# Patient Record
Sex: Female | Born: 1980 | Race: White | Marital: Married | Smoking: Never smoker
Health system: Northeastern US, Academic
[De-identification: ages and names within clinical notes are randomized; demographics above are authoritative.]

## PROBLEM LIST (undated history)

## (undated) ENCOUNTER — Inpatient Hospital Stay (HOSPITAL_COMMUNITY): Payer: BC Managed Care – PPO

## (undated) DIAGNOSIS — Z8619 Personal history of other infectious and parasitic diseases: Secondary | ICD-10-CM

## (undated) DIAGNOSIS — B977 Papillomavirus as the cause of diseases classified elsewhere: Secondary | ICD-10-CM

## (undated) DIAGNOSIS — O99111 Other diseases of the blood and blood-forming organs and certain disorders involving the immune mechanism complicating pregnancy, first trimester: Secondary | ICD-10-CM

## (undated) DIAGNOSIS — D6851 Activated protein C resistance: Secondary | ICD-10-CM

## (undated) HISTORY — PX: MOUTH SURGERY: SHX715

## (undated) HISTORY — DX: Activated protein C resistance: D68.51

## (undated) HISTORY — DX: Personal history of other infectious and parasitic diseases: Z86.19

## (undated) HISTORY — DX: Activated protein C resistance: O99.111

## (undated) HISTORY — PX: ANTERIOR CRUCIATE LIGAMENT REPAIR: SHX115

---

## 2001-04-25 ENCOUNTER — Other Ambulatory Visit: Payer: Self-pay | Admitting: Cardiology

## 2006-05-09 DIAGNOSIS — J309 Allergic rhinitis, unspecified: Secondary | ICD-10-CM | POA: Insufficient documentation

## 2012-03-26 DIAGNOSIS — S51809A Unspecified open wound of unspecified forearm, initial encounter: Secondary | ICD-10-CM | POA: Insufficient documentation

## 2012-03-26 DIAGNOSIS — Y998 Other external cause status: Secondary | ICD-10-CM | POA: Insufficient documentation

## 2012-03-26 DIAGNOSIS — Y93K1 Activity, walking an animal: Secondary | ICD-10-CM | POA: Insufficient documentation

## 2012-03-26 DIAGNOSIS — Y92009 Unspecified place in unspecified non-institutional (private) residence as the place of occurrence of the external cause: Secondary | ICD-10-CM | POA: Insufficient documentation

## 2012-03-26 DIAGNOSIS — W540XXA Bitten by dog, initial encounter: Secondary | ICD-10-CM | POA: Insufficient documentation

## 2012-03-27 ENCOUNTER — Emergency Department (HOSPITAL_COMMUNITY): Payer: BC Managed Care – PPO

## 2012-03-27 ENCOUNTER — Emergency Department (HOSPITAL_COMMUNITY)
Admission: EM | Admit: 2012-03-27 | Discharge: 2012-03-27 | Disposition: A | Discharge status: Home Health | Payer: BC Managed Care – PPO | Attending: Emergency Medicine | Admitting: Emergency Medicine

## 2012-03-27 ENCOUNTER — Encounter (HOSPITAL_COMMUNITY): Payer: Self-pay

## 2012-03-27 DIAGNOSIS — S41151A Open bite of right upper arm, initial encounter: Secondary | ICD-10-CM

## 2012-03-27 MED ORDER — HYDROCODONE-ACETAMINOPHEN 5-325 MG PO TABS
1.0000 | ORAL_TABLET | Freq: Once | ORAL | Status: AC
  Administered 2012-03-27: 1 via ORAL
  Filled 2012-03-27: qty 1

## 2012-03-27 MED ORDER — AMOXICILLIN-POT CLAVULANATE 875-125 MG PO TABS: 1.0000 | ORAL_TABLET | Freq: Two times a day (BID) | ORAL | Status: AC

## 2012-03-27 MED ORDER — AMOXICILLIN-POT CLAVULANATE 875-125 MG PO TABS
1.0000 | ORAL_TABLET | Freq: Once | ORAL | Status: AC
  Administered 2012-03-27: 1 via ORAL
  Filled 2012-03-27: qty 1

## 2012-03-27 MED ORDER — HYDROCODONE-ACETAMINOPHEN 5-325 MG PO TABS: 1.0000 | ORAL_TABLET | ORAL | Status: AC | PRN

## 2012-03-27 NOTE — ED Provider Notes (Signed)
Medical screening examination/treatment/procedure(s) were conducted as a shared visit with non-physician practitioner(s) and myself.  I personally evaluated the patient during the encounter  Infection warnings were given.  The patient's wounds were irrigated the bedside.  She was discharged home on Augmentin.  She understands to return to the ER for signs of infection as we discussed  Lyanne Co, MD 03/27/12 [phone]

## 2012-03-27 NOTE — Discharge Instructions (Signed)
Take the antibiotics as directed - follow up with Animal [employer] to see if the other dog has rabies up to date.  If you are still having pain after one week, then call Dr. Glenna Durand office and follow up with him.  Return here if needed.

## 2012-03-27 NOTE — ED Notes (Signed)
Pt was breaking up a fight between this dog and hers, dog has its shots and patient is familiar with the dog

## 2012-03-27 NOTE — ED Notes (Signed)
Pt stated as we were walking back to the room that she felt like her arm was broken because the dog shook it several times

## 2012-03-27 NOTE — ED Provider Notes (Signed)
History     CSN: [id number]  Arrival date & time 03/26/12  2328   First MD Initiated Contact with Patient 03/27/12 0158      Chief Complaint  Patient presents with  . Animal Bite    (Consider location/radiation/quality/duration/timing/severity/associated sxs/prior treatment) HPI Comments: Patient here with right forearm dog bite - she states that she was walking her dog when the neighbor's dog got out and started to attack her smaller dog - she states that she tried to stop the attack and was bitten once on the right forearm - reports last tetanus 2006 - spoke with the neighbor before coming here and was informed that the dog's shots were up to date. She reports pain at the bite site with mild numbness distal to the injury - she has full range of motion and no decrease in sensation at this time.  Patient is a 31 y.o. female presenting with animal bite. The history is provided by the patient. No language interpreter was used.  Animal Bite  The incident occurred just prior to arrival. The incident occurred at home. She came to the ER via personal transport. There is an injury to the right forearm. The pain is moderate. It is unlikely that a foreign body is present. Associated symptoms include numbness. Pertinent negatives include no chest pain, no visual disturbance, no abdominal pain, no bowel incontinence, no nausea, no vomiting, no bladder incontinence, no headaches, no hearing loss, no inability to bear weight, no neck pain, no pain when bearing weight, no focal weakness, no decreased responsiveness, no light-headedness, no loss of consciousness, no seizures, no tingling, no weakness, no cough, no difficulty breathing and no memory loss. There have been no prior injuries to these areas. She is right-handed. Her tetanus status is UTD. She has been behaving normally. She has received no recent medical care.    History reviewed. No pertinent past medical history.  History reviewed. No  pertinent past surgical history.  History reviewed. No pertinent family history.  History  Substance Use Topics  . Smoking status: Not on file  . Smokeless tobacco: Not on file  . Alcohol Use: No    OB History    Grav Para Term Preterm Abortions TAB SAB Ect Mult Living                  Review of Systems  Constitutional: Negative for decreased responsiveness.  HENT: Negative for hearing loss and neck pain.   Eyes: Negative for visual disturbance.  Respiratory: Negative for cough.   Cardiovascular: Negative for chest pain.  Gastrointestinal: Negative for nausea, vomiting, abdominal pain and bowel incontinence.  Genitourinary: Negative for bladder incontinence.  Neurological: Positive for numbness. Negative for tingling, focal weakness, seizures, loss of consciousness, weakness, light-headedness and headaches.  Psychiatric/Behavioral: Negative for memory loss.  All other systems reviewed and are negative.    Allergies  Review of patient's allergies indicates no known allergies.  Home Medications  No current outpatient prescriptions on file.  BP 120/81  Pulse 79  Temp(Src) 98.6 F (37 C) (Oral)  Resp 14  SpO2 99%  LMP 03/12/2012  Physical Exam  Nursing note and vitals reviewed. Constitutional: She is oriented to person, place, and time. She appears well-developed and well-nourished. No distress.  HENT:  Head: Normocephalic and atraumatic.  Right Ear: External ear normal.  Left Ear: External ear normal.  Nose: Nose normal.  Mouth/Throat: Oropharynx is clear and moist. No oropharyngeal exudate.  Eyes: Conjunctivae are normal. Pupils are equal,  round, and reactive to light. No scleral icterus.  Neck: Normal range of motion. Neck supple.  Cardiovascular: Normal rate, regular rhythm and normal heart sounds.  Exam reveals no gallop and no friction rub.   No murmur heard. Pulmonary/Chest: Effort normal and breath sounds normal. No respiratory distress. She has no  wheezes. She has no rales. She exhibits no tenderness.  Abdominal: Soft. Bowel sounds are normal. She exhibits no distension. There is no tenderness.  Musculoskeletal: Normal range of motion. She exhibits edema and tenderness.       Right forearm: She exhibits tenderness, swelling and laceration. She exhibits no bony tenderness, no edema and no deformity.       Arms: Lymphadenopathy:    She has no cervical adenopathy.  Neurological: She is alert and oriented to person, place, and time. A cranial nerve deficit is present. She exhibits normal muscle tone. Coordination normal.  Skin: Skin is warm and dry. No rash noted. No erythema.  Psychiatric: She has a normal mood and affect. Her behavior is normal. Thought content normal.    ED Course  Procedures (including critical care time)  Labs Reviewed - No data to display Dg Forearm Right  03/27/2012  *RADIOLOGY REPORT*  Clinical Data: Dog bite with puncture wound along the radial side of the forearm  RIGHT FOREARM - 2 VIEW  Comparison: None.  Findings: Proximal bandaging noted.  No fracture, foreign body, or retained canine tooth noted.  Soft tissue swelling is noted along the dorsal forearm proximally.  IMPRESSION:  1.  Soft tissue swelling.  No retained foreign body or fracture.  Original Report Authenticated By: Dellia Cloud, M.D.     Dog bite to right forearm    MDM  Patient with uncomplicated dog bite to right forearm.  I have spoken with the patient about getting proof of the other dog's immunization status and she knows to return here or Urgent Care if she needs the rabies series.  X-ray shows no foreign body and has been started on abx prophylactically. Scarlette Calico C. [place], [place] 03/27/12 [phone]

## 2012-11-17 ENCOUNTER — Other Ambulatory Visit (HOSPITAL_COMMUNITY)
Admission: RE | Admit: 2012-11-17 | Discharge: 2012-11-17 | Disposition: A | Discharge status: Home / Self Care | Payer: BC Managed Care – PPO | Source: Ambulatory Visit | Attending: Family Medicine | Admitting: Family Medicine

## 2012-11-17 ENCOUNTER — Other Ambulatory Visit: Payer: Self-pay | Admitting: Family Medicine

## 2012-11-17 DIAGNOSIS — Z124 Encounter for screening for malignant neoplasm of cervix: Secondary | ICD-10-CM | POA: Insufficient documentation

## 2013-06-24 IMAGING — CR DG FOREARM 2V*R*
2 series · 2 of 2 positions shown · non-contrast
Comparison: None.

CLINICAL DATA: Dog bite with puncture wound along the radial side
of the forearm

RIGHT FOREARM - 2 VIEW

[x forearm ap right]
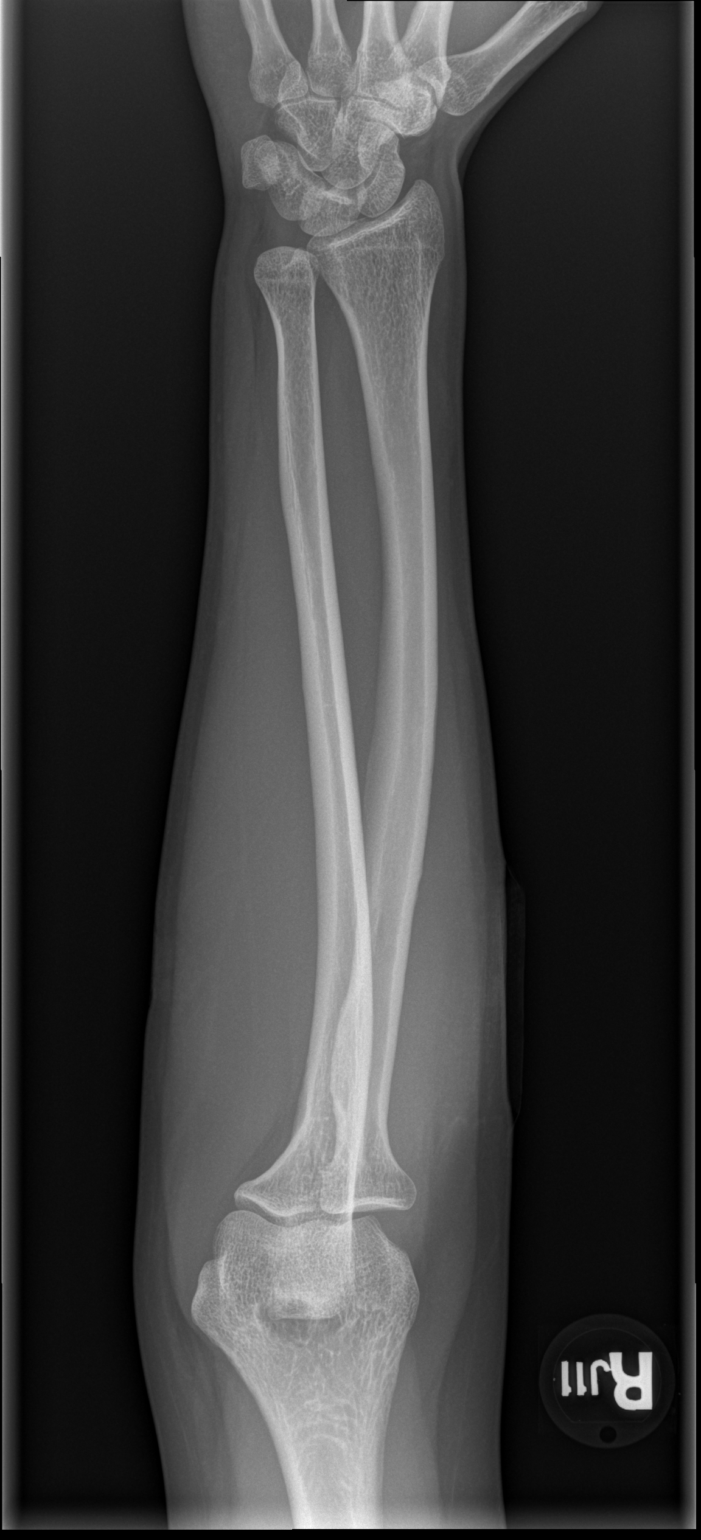

[x forearm lat right]
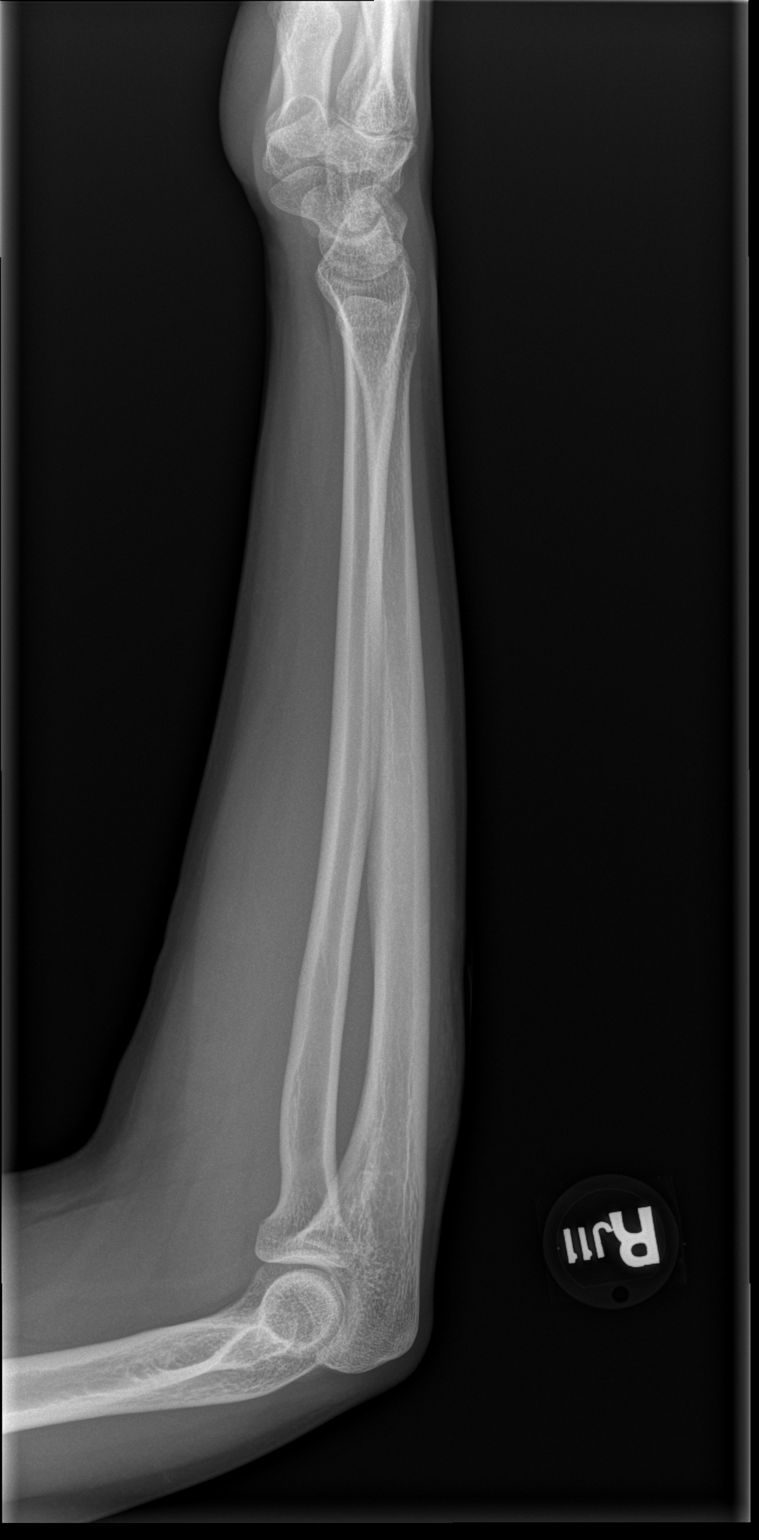

[2 of 2 positions shown; findings below may reference images not displayed]

FINDINGS: Proximal bandaging noted.  No fracture, foreign body, or
retained canine tooth noted.  Soft tissue swelling is noted along
the dorsal forearm proximally.
IMPRESSION: 1.  Soft tissue swelling.  No retained foreign body or fracture.

## 2015-04-28 LAB — OB RESULTS CONSOLE RUBELLA ANTIBODY, IGM: RUBELLA: IMMUNE

## 2015-04-28 LAB — OB RESULTS CONSOLE HEPATITIS B SURFACE ANTIGEN: Hepatitis B Surface Ag: NEGATIVE

## 2015-04-28 LAB — OB RESULTS CONSOLE ABO/RH: RH Type: POSITIVE

## 2015-04-28 LAB — OB RESULTS CONSOLE HIV ANTIBODY (ROUTINE TESTING): HIV: NONREACTIVE

## 2015-04-28 LAB — OB RESULTS CONSOLE RPR: RPR: NONREACTIVE

## 2015-04-28 LAB — OB RESULTS CONSOLE ANTIBODY SCREEN: ANTIBODY SCREEN: NEGATIVE

## 2015-05-11 LAB — OB RESULTS CONSOLE GC/CHLAMYDIA
Chlamydia: NEGATIVE
GC PROBE AMP, GENITAL: NEGATIVE

## 2015-11-13 NOTE — L&D Delivery Note (Signed)
In room to evaluate pushing in light of recent elevated temp that required initiation of antibiotics. Pt in hands/knees position. Pushing now x 2 hrs w/ good effort. She reported fatigue but wanted to continue as long as possible. Dr. Estanislado Pandy consulted and ok with pushing a little longer provided pt was making progress. She pushed for another hour and delivered as follows:   Delivery Note At 6:26 AM a viable female "Marissa Molina" was delivered via Vaginal, Spontaneous Delivery (Presentation: Direct OP). APGARS: 7, 9; weight 7 lbs 0.9 oz (3200 g). End stage meconium noted.    Placenta status: Intact, Spontaneous. Cord: 3 vessels with the following complications: Loose nuchal x 1. Cord pH: NA  Anesthesia: Epidural  Episiotomy: None Lacerations: 2nd degree Perineal Suture Repair: 3.0 vicryl CT-1 Est. Blood Loss (mL): 200  Mom to postpartum.  Baby to Couplet care / Skin to Skin.  Birth control not discussed.  Circumcision not discussed.  Factor V Leiden carrier-heterozygous -- will begin Lovenox prophylaxis per protocol.  Continue IV antibiotics (Unasyn) x 24 hrs.  Sherre Scarlet 12/25/2015, 7:46 AM

## 2015-11-24 LAB — OB RESULTS CONSOLE GBS: GBS: POSITIVE

## 2015-12-13 ENCOUNTER — Telehealth (HOSPITAL_COMMUNITY): Payer: Self-pay | Admitting: *Deleted

## 2015-12-13 ENCOUNTER — Encounter (HOSPITAL_COMMUNITY): Payer: Self-pay | Admitting: *Deleted

## 2015-12-13 NOTE — Telephone Encounter (Signed)
Preadmission screen  

## 2015-12-15 ENCOUNTER — Telehealth (HOSPITAL_COMMUNITY): Payer: Self-pay | Admitting: *Deleted

## 2015-12-15 ENCOUNTER — Encounter (HOSPITAL_COMMUNITY): Payer: Self-pay | Admitting: *Deleted

## 2015-12-15 NOTE — Telephone Encounter (Signed)
Preadmission screen  

## 2015-12-22 ENCOUNTER — Inpatient Hospital Stay (HOSPITAL_COMMUNITY): Admission: RE | Admit: 2015-12-22 | Payer: BC Managed Care – PPO | Source: Ambulatory Visit

## 2015-12-22 NOTE — H&P (Signed)
HPI: 35 y/o G2P0 @ [redacted]w[redacted]d estimated gestational age (as dated by LMP c/w 8 week ultrasound) presents for IOL for postdates.   no Leaking of Fluid,   no Vaginal Bleeding,   irregular Uterine Contractions,  + Fetal Movement.  ROS: no HA, no epigastric pain, no visual changes.    Pregnancy complicated by: 1) Factor V Leiden carrier-heterozygous-  plan for postpartum Lovenox prophylaxis 2) Size less than dates: Last [place] @ 37w: vertex/posterior/EFW: 6#7oz (49%)   Prenatal Transfer Tool  Maternal Diabetes: No Genetic Screening: Normal Maternal Ultrasounds/Referrals: Normal Fetal Ultrasounds or other Referrals:  None Maternal Substance Abuse:  No Significant Maternal Medications:  None Significant Maternal Lab Results: Lab values include: Group B Strep positive   PNL:  GBS POSITIVE, Rub Immune, Hep B neg, RPR NR, HIV neg, GC/C neg, glucola:126 Hgb: 12.2 Blood type: O positive, antibody neg Immunization:  Tdap given 10/12/15 Flu shot: 07/2015  OBHx: primip PMHx:  Factor V Leiden carrier-heterozygote Meds:  PNV Allergy:  No Known Allergies SurgHx: none SocHx:   no Tobacco, no  EtOH, no Illicit Drugs  O: LMP 03/13/2015 Performed in office on 12/22/15 Gen. AAOx3, NAD Abd: gravid Extr.  no edema B/L , no calf tenderness FHT: 130, moderate variability, +accels, no decels Toco: irregular SVE: closed/long/high   Labs: see orders  A/P:  35 y.o. G1P0 @ [redacted]w[redacted]d EGA who presents for IOL for postdates -FWB:  NICHD Cat I FHTs -Labor: plan for cytotec -GBS: positive, will hold on PCN for now.  Plan to start with placement of Foley, rupture or in active labor -Pain management: upon request -Factor V Leiden heterozygous: Pt will require postpartum Lovenox prophylaxis.  If prolonged labor, consider SCDs  Myna Hidalgo, DO [phone] (pager) [phone] (office)

## 2015-12-24 ENCOUNTER — Inpatient Hospital Stay (HOSPITAL_COMMUNITY): Payer: BC Managed Care – PPO | Admitting: Anesthesiology

## 2015-12-24 ENCOUNTER — Inpatient Hospital Stay (HOSPITAL_COMMUNITY)
Admission: AD | Admit: 2015-12-24 | Discharge: 2015-12-27 | DRG: 775 | Disposition: A | Discharge status: Home / Self Care | Payer: BC Managed Care – PPO | Source: Ambulatory Visit | Attending: Obstetrics & Gynecology | Admitting: Obstetrics & Gynecology

## 2015-12-24 ENCOUNTER — Encounter (HOSPITAL_COMMUNITY): Payer: Self-pay

## 2015-12-24 DIAGNOSIS — O480 Post-term pregnancy: Secondary | ICD-10-CM | POA: Diagnosis present

## 2015-12-24 DIAGNOSIS — D6851 Activated protein C resistance: Secondary | ICD-10-CM | POA: Diagnosis present

## 2015-12-24 DIAGNOSIS — O9912 Other diseases of the blood and blood-forming organs and certain disorders involving the immune mechanism complicating childbirth: Secondary | ICD-10-CM | POA: Diagnosis present

## 2015-12-24 DIAGNOSIS — O99824 Streptococcus B carrier state complicating childbirth: Secondary | ICD-10-CM | POA: Diagnosis present

## 2015-12-24 DIAGNOSIS — B951 Streptococcus, group B, as the cause of diseases classified elsewhere: Secondary | ICD-10-CM

## 2015-12-24 DIAGNOSIS — O99119 Other diseases of the blood and blood-forming organs and certain disorders involving the immune mechanism complicating pregnancy, unspecified trimester: Secondary | ICD-10-CM

## 2015-12-24 DIAGNOSIS — Z3A41 41 weeks gestation of pregnancy: Secondary | ICD-10-CM

## 2015-12-24 DIAGNOSIS — O411230 Chorioamnionitis, third trimester, not applicable or unspecified: Secondary | ICD-10-CM | POA: Diagnosis present

## 2015-12-24 HISTORY — DX: Papillomavirus as the cause of diseases classified elsewhere: B97.7

## 2015-12-24 LAB — TYPE AND SCREEN
ABO/RH(D): O POS
ANTIBODY SCREEN: NEGATIVE

## 2015-12-24 LAB — RPR: RPR Ser Ql: NONREACTIVE

## 2015-12-24 LAB — CBC
HCT: 40.2 % (ref 36.0–46.0)
Hemoglobin: 14 g/dL (ref 12.0–15.0)
MCH: 30.6 pg (ref 26.0–34.0)
MCHC: 34.8 g/dL (ref 30.0–36.0)
MCV: 87.8 fL (ref 78.0–100.0)
Platelets: 219 10*3/uL (ref 150–400)
RBC: 4.58 MIL/uL (ref 3.87–5.11)
RDW: 14 % (ref 11.5–15.5)
WBC: 13.6 10*3/uL — AB (ref 4.0–10.5)

## 2015-12-24 LAB — ABO/RH: ABO/RH(D): O POS

## 2015-12-24 MED ORDER — ONDANSETRON HCL 4 MG/2ML IJ SOLN
4.0000 mg | Freq: Four times a day (QID) | INTRAMUSCULAR | Status: DC | PRN
  Administered 2015-12-24: 4 mg via INTRAVENOUS
  Filled 2015-12-24: qty 2

## 2015-12-24 MED ORDER — FENTANYL 2.5 MCG/ML BUPIVACAINE 1/10 % EPIDURAL INFUSION (WH - ANES)
14.0000 mL/h | INTRAMUSCULAR | Status: DC | PRN
  Administered 2015-12-25: 14 mL/h via EPIDURAL
  Filled 2015-12-24: qty 125

## 2015-12-24 MED ORDER — PHENYLEPHRINE 40 MCG/ML (10ML) SYRINGE FOR IV PUSH (FOR BLOOD PRESSURE SUPPORT)
80.0000 ug | PREFILLED_SYRINGE | INTRAVENOUS | Status: DC | PRN
  Filled 2015-12-24: qty 2

## 2015-12-24 MED ORDER — NALBUPHINE HCL 10 MG/ML IJ SOLN
5.0000 mg | INTRAMUSCULAR | Status: DC | PRN
  Administered 2015-12-24 (×2): 5 mg via INTRAVENOUS
  Filled 2015-12-24 (×2): qty 1

## 2015-12-24 MED ORDER — FENTANYL 2.5 MCG/ML BUPIVACAINE 1/10 % EPIDURAL INFUSION (WH - ANES)
14.0000 mL/h | INTRAMUSCULAR | Status: DC | PRN
  Administered 2015-12-24: 14 mL/h via EPIDURAL
  Filled 2015-12-24: qty 125

## 2015-12-24 MED ORDER — ACETAMINOPHEN 325 MG PO TABS
650.0000 mg | ORAL_TABLET | ORAL | Status: DC | PRN
  Administered 2015-12-25: 650 mg via ORAL
  Filled 2015-12-24: qty 2

## 2015-12-24 MED ORDER — EPHEDRINE 5 MG/ML INJ
10.0000 mg | INTRAVENOUS | Status: DC | PRN
  Filled 2015-12-24: qty 2

## 2015-12-24 MED ORDER — OXYCODONE-ACETAMINOPHEN 5-325 MG PO TABS: 1.0000 | ORAL_TABLET | ORAL | Status: DC | PRN

## 2015-12-24 MED ORDER — DIPHENHYDRAMINE HCL 50 MG/ML IJ SOLN: 12.5000 mg | INTRAMUSCULAR | Status: DC | PRN

## 2015-12-24 MED ORDER — OXYTOCIN 10 UNIT/ML IJ SOLN
1.0000 m[IU]/min | INTRAVENOUS | Status: DC
  Administered 2015-12-24: 2 m[IU]/min via INTRAVENOUS

## 2015-12-24 MED ORDER — FLEET ENEMA 7-19 GM/118ML RE ENEM: 1.0000 | ENEMA | RECTAL | Status: DC | PRN

## 2015-12-24 MED ORDER — PHENYLEPHRINE 40 MCG/ML (10ML) SYRINGE FOR IV PUSH (FOR BLOOD PRESSURE SUPPORT): 80.0000 ug | PREFILLED_SYRINGE | INTRAVENOUS | Status: DC | PRN

## 2015-12-24 MED ORDER — EPHEDRINE 5 MG/ML INJ: 10.0000 mg | INTRAVENOUS | Status: DC | PRN

## 2015-12-24 MED ORDER — LIDOCAINE HCL (PF) 1 % IJ SOLN
INTRAMUSCULAR | Status: DC | PRN
  Administered 2015-12-24 (×2): 4 mL via EPIDURAL

## 2015-12-24 MED ORDER — LACTATED RINGERS IV SOLN
500.0000 mL | Freq: Once | INTRAVENOUS | Status: AC
  Administered 2015-12-24: 500 mL via INTRAVENOUS

## 2015-12-24 MED ORDER — OXYCODONE-ACETAMINOPHEN 5-325 MG PO TABS: 2.0000 | ORAL_TABLET | ORAL | Status: DC | PRN

## 2015-12-24 MED ORDER — CITRIC ACID-SODIUM CITRATE 334-500 MG/5ML PO SOLN
30.0000 mL | ORAL | Status: DC | PRN
  Administered 2015-12-25: 30 mL via ORAL
  Filled 2015-12-24: qty 15

## 2015-12-24 MED ORDER — LACTATED RINGERS IV SOLN: 500.0000 mL | Freq: Once | INTRAVENOUS | Status: DC

## 2015-12-24 MED ORDER — PHENYLEPHRINE 40 MCG/ML (10ML) SYRINGE FOR IV PUSH (FOR BLOOD PRESSURE SUPPORT)
80.0000 ug | PREFILLED_SYRINGE | INTRAVENOUS | Status: DC | PRN
  Filled 2015-12-24: qty 20

## 2015-12-24 MED ORDER — PENICILLIN G POTASSIUM 5000000 UNITS IJ SOLR
5.0000 10*6.[IU] | Freq: Once | INTRAVENOUS | Status: AC
  Administered 2015-12-24: 5 10*6.[IU] via INTRAVENOUS
  Filled 2015-12-24: qty 5

## 2015-12-24 MED ORDER — PENICILLIN G POTASSIUM 5000000 UNITS IJ SOLR
2.5000 10*6.[IU] | INTRAMUSCULAR | Status: DC
  Administered 2015-12-24 – 2015-12-25 (×6): 2.5 10*6.[IU] via INTRAVENOUS
  Filled 2015-12-24 (×9): qty 2.5

## 2015-12-24 MED ORDER — LACTATED RINGERS IV SOLN: INTRAVENOUS | Status: DC

## 2015-12-24 MED ORDER — TERBUTALINE SULFATE 1 MG/ML IJ SOLN
0.2500 mg | Freq: Once | INTRAMUSCULAR | Status: DC | PRN
  Filled 2015-12-24: qty 1

## 2015-12-24 MED ORDER — LACTATED RINGERS IV SOLN
2.5000 [IU]/h | INTRAVENOUS | Status: DC
  Filled 2015-12-24: qty 4

## 2015-12-24 MED ORDER — LIDOCAINE HCL (PF) 1 % IJ SOLN
30.0000 mL | INTRAMUSCULAR | Status: DC | PRN
Start: 2015-12-24 — End: 2015-12-25
  Filled 2015-12-24: qty 30

## 2015-12-24 MED ORDER — OXYTOCIN BOLUS FROM INFUSION
500.0000 mL | INTRAVENOUS | Status: DC
  Administered 2015-12-25: 500 mL via INTRAVENOUS

## 2015-12-24 MED ORDER — LACTATED RINGERS IV SOLN
500.0000 mL | INTRAVENOUS | Status: DC | PRN
  Administered 2015-12-24: 1000 mL via INTRAVENOUS
  Administered 2015-12-25: 500 mL via INTRAVENOUS
  Administered 2015-12-25: 1000 mL via INTRAVENOUS

## 2015-12-24 NOTE — Progress Notes (Signed)
Subjective: In room to the meet the acquaintance of patient and husband. Coping with contractions, reports they are getting much stronger and she recently received IV nubain for pain medication.  May continue to labor with the intent to utilize as limited pain medication as possible but is also considering the possibility of an epidural.   Currently does not desire a vaginal exam to assess cervical dilation.   Objective: BP 130/85 mmHg  Pulse 64  Temp(Src) 98.2 F (36.8 C) (Oral)  Resp 20  Ht  (1.676 m)  Wt 76.204 kg (168 lb)  BMI 27.13 kg/m2  SpO2 99%  LMP 03/13/2015     FHT: Category 1, 130 bpm, moderate variability, +accels, no decels  UC:   regular, every 4-5 minutes SVE:   Dilation: 3 Effacement (%): 90 Station: -1 Exam by:: B Boyer  Membranes: Intact  Augmentation/Induction:  Pitocin: None Cytotec: None  GBS prophylaxis:  PCN x1 doses Pain management:   IV nubain at 0742   Assessment:  IUP 40.6 wks Postdates Early labor GBS positive Cat 1 tracing Factor V Leiden Carrier- heterozygous  Plan: Expectant management May continue Intermittent fetal monitoring, if Cat 1 tracing and has not received IV pain medication Apply SCD's if patient remains in bed  Frequent movement and position changes to facilitate fetal descent and rotation Postpartum Lovenox Prophylaxis  Alphonzo Severance CNM, [place] 12/24/2015, 8:07 AM

## 2015-12-24 NOTE — Consult Note (Signed)
INITIAL CRNA CONSULT  The patient birth plan is to proceed with a natural birth at present but is not ruling out the possibility of an epidural. The pain score was explained and I said that she just needed to inform her RN if she changed her mind.  Pain Goal: 3 Pain Score:7  Karleen Dolphin CRNA

## 2015-12-24 NOTE — MAU Note (Signed)
Contractions since 6 pm but worse since midnight, every 3 min.  No bleeding.  No leaking. Baby moving well.

## 2015-12-24 NOTE — Anesthesia Preprocedure Evaluation (Signed)
Anesthesia Evaluation  Patient identified by MRN, date of birth, ID band Patient awake    Reviewed: Allergy & Precautions, H&P , NPO status , Patient's Chart, lab work & pertinent test results  Airway Mallampati: II  TM Distance: >3 FB Neck ROM: full    Dental no notable dental hx. (+) Dental Advisory Given, Teeth Intact   Pulmonary neg pulmonary ROS,    Pulmonary exam normal breath sounds clear to auscultation       Cardiovascular Exercise Tolerance: Good negative cardio ROS Normal cardiovascular exam Rhythm:regular Rate:Normal     Neuro/Psych negative neurological ROS  negative psych ROS   GI/Hepatic negative GI ROS, Neg liver ROS,   Endo/Other  negative endocrine ROS  Renal/GU negative Renal ROS  negative genitourinary   Musculoskeletal   Abdominal   Peds  Hematology negative hematology ROS (+) Factor V Leiden heterozygous   Anesthesia Other Findings   Reproductive/Obstetrics negative OB ROS                             Anesthesia Physical Anesthesia Plan  ASA: II  Anesthesia Plan: Epidural   Post-op Pain Management:    Induction:   Airway Management Planned:   Additional Equipment:   Intra-op Plan:   Post-operative Plan:   Informed Consent: I have reviewed the patients History and Physical, chart, labs and discussed the procedure including the risks, benefits and alternatives for the proposed anesthesia with the patient or authorized representative who has indicated his/her understanding and acceptance.   Dental Advisory Given  Plan Discussed with: CRNA and Surgeon  Anesthesia Plan Comments:         Anesthesia Quick Evaluation

## 2015-12-24 NOTE — Progress Notes (Signed)
Subjective: Pt breathing through contractions.  Declines cervical check at this time.  Last check was @ 10am. 5/90/0  Objective: BP 127/78 mmHg  Pulse 75  Temp(Src) 97.8 F (36.6 C) (Oral)  Resp 20  Ht  (1.676 m)  Wt 76.204 kg (168 lb)  BMI 27.13 kg/m2  SpO2 99%  LMP 03/13/2015      FHT: Internittant FM - Cat 1  UC:   regular, every 2-4 minutes SVE:   Dilation: 5 Effacement (%): 90 Station: 0 Exam by:: Smith RN Membranes: Intact  Augmentation/Induction:  Pitocin: None Cytotec: None  GBS prophylaxis: PCN x 3 doses Pain management: IV nubain at 0742   Assessment:  IUP 40.6 wks Postdates Early labor GBS positive Cat 1 tracing Factor V Leiden Carrier- heterozygous  Plan: Expectant management Discussed R/B of amniotomy as a method of labor augmentation, at this time patient declines May continue Intermittent fetal monitoring, if Cat 1 tracing and has not received IV pain medication Apply SCD's if patient remains in bed  Frequent movement and position changes to facilitate fetal descent and rotation Postpartum Lovenox Prophylaxis  Alphonzo Severance CNM, [place] 12/24/2015, 2:58 PM

## 2015-12-24 NOTE — Progress Notes (Addendum)
Subjective:  Called by RN,  SVE still 5/90/0 however cervix is now more midline/ Pt desires more Pain IV medication. In room to assess patient, pt increasingly uncomfortable, tired and discourage regarding pt's perceived lack of progress.   Pt still attempting to go as "natural" as possible.   Objective: BP 136/77 mmHg  Pulse 85  Temp(Src) 98.5 F (36.9 C) (Oral)  Resp 20  Ht  (1.676 m)  Wt 76.204 kg (168 lb)  BMI 27.13 kg/m2  SpO2 99%  LMP 03/13/2015      FHT: Category 1, 140, moderate variability, +accels, no decels UC:   regular, every 2-5 minutes SVE:   Dilation: 5 Effacement (%): 90 Station: 0 Exam by:: Smith RN Membranes: Intact  Augmentation/Induction:  Pitocin: None Cytotec: None  GBS prophylaxis: PCN x 4 doses Pain management: IV nubain at 0742  &  1556   Assessment:  IUP 40.6 wks Postdates Early labor Membranes intact GBS positive - X4 doses of PCN Cat 1 tracing Factor V Leiden Carrier- heterozygous  Plan: IV pain medication  Expectant management -Revisited R/B of amniotomy as a method of labor augmentation- Pt agreeable to amniotomy if no cervical change at next check -Will re-access cervical dilation in one hour and consider amniotomy in the absence of cervical dilation.  Frequent movement and position changes to facilitate fetal descent and rotation  Postpartum Lovenox Prophylaxis  Alphonzo Severance CNM, [place] 12/24/2015, 4:07 PM   Called by Rn. Informed that pt desires an epidural.   Will reassess cervical dilation and consider Amniotomy/ IUPC placement once epidural is placed and pt is comfortable.   Alphonzo Severance, CNM 12/24/15

## 2015-12-24 NOTE — Progress Notes (Addendum)
Assuming care of [facility], 35 yo G2P0 @ 40.6 wks admitted around 3 am on 12/24/15 in latent labor. Presented w/ birth plan and desired NCB. Pain initially controlled w/ IV pain medication, then became too unbearable. She received an Epidural at 1745. Spouse at bedside, supportive and sleep deprived.  Subjective: Overall comfortable w/ epidural, can still feel ctxs, "but nothing like before." Tired, has not slept, "ready to be done." Endorses FM. Denies leaking or active bleeding.  Objective: BP 136/83 mmHg  Pulse 69  Temp(Src) 98.1 F (36.7 C) (Oral)  Resp 18  Ht  (1.676 m)  Wt 76.204 kg (168 lb)  BMI 27.13 kg/m2  SpO2 99%  LMP 03/13/2015   Today's Vitals   12/24/15 1947 12/24/15 2000 12/24/15 2016 12/24/15 2031  BP:  134/71  136/83  Pulse:  72  69  Temp:  98.1 F (36.7 C)    TempSrc:  Oral    Resp:  18    Height:      Weight:      SpO2:      PainSc: 0-No pain  0-No pain    FHT: BL 135 w/ moderate variability, +accels, no decels UC: irregular, every 2-4 minutes, MVUs 113 as of 2032 SVE:   Dilation: 5 Effacement (%): 90 Station: 0 Exam by:: Thornell Mule CNM @ 1956 Last [place] @ 37w: vertex/posterior/EFW: 6#7oz (49th%tile) -- pelvis feels adequate Pitocin at 4 mU/min @ 2022  AROM'd at 1956, scant amount of blood tinged fluid - head well applied to cvx IUPC placed at 2000 w/o difficulty SCDs in place  Assessment:  IUP at 40.6 wks Latent phase labor Inadequate MVUs GBS positive; adequately treated Cat 1 FHRT Factor V Leiden carrier-heterozygous  Size less than dates: Last [place] @ 37w: vertex/posterior/EFW: 6#7oz (49th%tile)  Plan: Continue plan Advised to rest as much as possible Titrate Pitocin to achieve a minimum of 200 MVUs PP Lovenox prophylaxis - consult re: dosing Expect progress and SVD   Sherre Scarlet CNM 12/24/2015, 8:37 PM

## 2015-12-24 NOTE — Progress Notes (Signed)
  Subjective: No c/o.  Objective: BP 136/83 mmHg  Pulse 69  Temp(Src) 98.1 F (36.7 C) (Oral)  Resp 18  Ht  (1.676 m)  Wt 76.204 kg (168 lb)  BMI 27.13 kg/m2  SpO2 99%  LMP 03/13/2015     Today's Vitals   12/24/15 1947 12/24/15 2000 12/24/15 2016 12/24/15 2031  BP:  134/71  136/83  Pulse:  72  69  Temp:  98.1 F (36.7 C)    TempSrc:  Oral    Resp:  18  18  Height:      Weight:      SpO2:      PainSc: 0-No pain  0-No pain    FHT: BL 125 w/ moderate variability, +accels, no lates  Variable decels: 2036: Nadir 60 bpm x 18 secs w/ gradual return to baseline 2044: Nadir 70 bpm x 15 secs w/ gradual return to baseline 2049: Nadir 64 bpm x 23 secs w/ gradual return to baseline 2059: Nadir 100 bpm x 10 secs w/ gradual return to baseline  UC:   regular, every 3 minutes, MVUs 180 SVE: Deferred    Pitocin at 4 mU/min  Assessment:  Cat 2 FHRT resolved to Cat 1 w/ position change  Plan: Amnioinfusion prn Continue intrauterine resuscitative measures as clinically warranted Consult as indicated Expect progress and SVD  Sherre Scarlet CNM 12/24/2015, 9:07 PM

## 2015-12-24 NOTE — Anesthesia Procedure Notes (Signed)
Epidural Patient location during procedure: OB Start time: 12/24/2015 5:45 PM End time: 12/24/2015 5:50 PM  Staffing Anesthesiologist: Ronelle Nigh Performed by: anesthesiologist   Preanesthetic Checklist Completed: patient identified, site marked, surgical consent, pre-op evaluation, timeout performed, IV checked, risks and benefits discussed and monitors and equipment checked  Epidural Patient position: sitting Prep: site prepped and draped and DuraPrep Patient monitoring: continuous pulse ox and blood pressure Approach: midline Location: L3-L4 Injection technique: LOR air  Needle:  Needle type: Tuohy  Needle gauge: 17 G Needle length: 9 cm and 9 Needle insertion depth: 6 cm Catheter type: closed end flexible Catheter size: 19 Gauge Catheter at skin depth: 11 cm Test dose: negative  Assessment Sensory level: T10 Events: blood not aspirated, injection not painful, no injection resistance, negative IV test and no paresthesia  Additional Notes Patient identified. Risks/Benefits/Options discussed with patient including but not limited to bleeding, infection, nerve damage, paralysis, failed block, incomplete pain control, headache, blood pressure changes, nausea, vomiting, reactions to medication both or allergic, itching and postpartum back pain. Confirmed with bedside nurse the patient's most recent platelet count. Confirmed with patient that they are not currently taking any anticoagulation, have any bleeding history or any family history of bleeding disorders. Patient expressed understanding and wished to proceed. All questions were answered. Sterile technique was used throughout the entire procedure. Please see nursing notes for vital signs. Test dose was given through epidural catheter and negative prior to continuing to dose epidural or start infusion. Warning signs of high block given to the patient including shortness of breath, tingling/numbness in hands, complete motor  block, or any concerning symptoms with instructions to call for help. Patient was given instructions on fall risk and not to get out of bed. All questions and concerns addressed with instructions to call with any issues or inadequate analgesia.

## 2015-12-25 ENCOUNTER — Encounter (HOSPITAL_COMMUNITY): Payer: Self-pay | Admitting: *Deleted

## 2015-12-25 LAB — CBC
HCT: 33.8 % — ABNORMAL LOW (ref 36.0–46.0)
Hemoglobin: 11.5 g/dL — ABNORMAL LOW (ref 12.0–15.0)
MCH: 30.2 pg (ref 26.0–34.0)
MCHC: 34 g/dL (ref 30.0–36.0)
MCV: 88.7 fL (ref 78.0–100.0)
PLATELETS: 171 10*3/uL (ref 150–400)
RBC: 3.81 MIL/uL — AB (ref 3.87–5.11)
RDW: 14.5 % (ref 11.5–15.5)
WBC: 19.5 10*3/uL — AB (ref 4.0–10.5)

## 2015-12-25 LAB — CREATININE, SERUM: CREATININE: 0.96 mg/dL (ref 0.44–1.00)

## 2015-12-25 MED ORDER — SENNOSIDES-DOCUSATE SODIUM 8.6-50 MG PO TABS
2.0000 | ORAL_TABLET | ORAL | Status: DC
  Administered 2015-12-25 – 2015-12-26 (×2): 2 via ORAL
  Filled 2015-12-25 (×2): qty 2

## 2015-12-25 MED ORDER — ONDANSETRON HCL 4 MG/2ML IJ SOLN: 4.0000 mg | INTRAMUSCULAR | Status: DC | PRN

## 2015-12-25 MED ORDER — BENZOCAINE-MENTHOL 20-0.5 % EX AERO
1.0000 "application " | INHALATION_SPRAY | CUTANEOUS | Status: DC | PRN
  Administered 2015-12-25: 1 via TOPICAL
  Filled 2015-12-25: qty 56

## 2015-12-25 MED ORDER — LACTATED RINGERS IV SOLN
INTRAVENOUS | Status: DC
  Administered 2015-12-25 (×2): via INTRAVENOUS

## 2015-12-25 MED ORDER — WITCH HAZEL-GLYCERIN EX PADS: 1.0000 "application " | MEDICATED_PAD | CUTANEOUS | Status: DC | PRN

## 2015-12-25 MED ORDER — ENOXAPARIN SODIUM 40 MG/0.4ML ~~LOC~~ SOLN
40.0000 mg | SUBCUTANEOUS | Status: DC
Start: 2015-12-25 — End: 2015-12-27
  Administered 2015-12-25 – 2015-12-26 (×2): 40 mg via SUBCUTANEOUS
  Filled 2015-12-25 (×2): qty 0.4

## 2015-12-25 MED ORDER — PRENATAL MULTIVITAMIN CH
1.0000 | ORAL_TABLET | Freq: Every day | ORAL | Status: DC
Start: 2015-12-25 — End: 2015-12-27
  Administered 2015-12-25: 1 via ORAL
  Filled 2015-12-25 (×2): qty 1

## 2015-12-25 MED ORDER — AMPICILLIN-SULBACTAM SODIUM 3 (2-1) G IJ SOLR
3.0000 g | Freq: Four times a day (QID) | INTRAMUSCULAR | Status: DC
  Administered 2015-12-25: 3 g via INTRAVENOUS
  Filled 2015-12-25 (×3): qty 3

## 2015-12-25 MED ORDER — ONDANSETRON HCL 4 MG PO TABS: 4.0000 mg | ORAL_TABLET | ORAL | Status: DC | PRN

## 2015-12-25 MED ORDER — ACETAMINOPHEN 325 MG PO TABS: 650.0000 mg | ORAL_TABLET | ORAL | Status: DC | PRN

## 2015-12-25 MED ORDER — LANOLIN HYDROUS EX OINT: TOPICAL_OINTMENT | CUTANEOUS | Status: DC | PRN

## 2015-12-25 MED ORDER — TETANUS-DIPHTH-ACELL PERTUSSIS 5-2.5-18.5 LF-MCG/0.5 IM SUSP: 0.5000 mL | Freq: Once | INTRAMUSCULAR | Status: DC

## 2015-12-25 MED ORDER — DIPHENHYDRAMINE HCL 25 MG PO CAPS
25.0000 mg | ORAL_CAPSULE | Freq: Four times a day (QID) | ORAL | Status: DC | PRN
Start: 2015-12-25 — End: 2015-12-27

## 2015-12-25 MED ORDER — DIBUCAINE 1 % RE OINT: 1.0000 "application " | TOPICAL_OINTMENT | RECTAL | Status: DC | PRN

## 2015-12-25 MED ORDER — IBUPROFEN 600 MG PO TABS
600.0000 mg | ORAL_TABLET | Freq: Four times a day (QID) | ORAL | Status: DC
  Administered 2015-12-25 – 2015-12-26 (×5): 600 mg via ORAL
  Filled 2015-12-25 (×8): qty 1

## 2015-12-25 MED ORDER — ZOLPIDEM TARTRATE 5 MG PO TABS: 5.0000 mg | ORAL_TABLET | Freq: Every evening | ORAL | Status: DC | PRN

## 2015-12-25 MED ORDER — SODIUM CHLORIDE 0.9% FLUSH
3.0000 mL | INTRAVENOUS | Status: DC | PRN
  Administered 2015-12-25: 3 mL via INTRAVENOUS

## 2015-12-25 MED ORDER — SODIUM CHLORIDE 0.9 % IV SOLN
3.0000 g | Freq: Four times a day (QID) | INTRAVENOUS | Status: DC
  Administered 2015-12-25 (×3): 3 g via INTRAVENOUS
  Filled 2015-12-25 (×7): qty 3

## 2015-12-25 MED ORDER — OXYCODONE-ACETAMINOPHEN 5-325 MG PO TABS: 1.0000 | ORAL_TABLET | ORAL | Status: DC | PRN

## 2015-12-25 MED ORDER — SIMETHICONE 80 MG PO CHEW: 80.0000 mg | CHEWABLE_TABLET | ORAL | Status: DC | PRN

## 2015-12-25 NOTE — Lactation Note (Signed)
This note was copied from a baby's chart. Lactation Consultation Note  Patient Name: Marissa Molina [id number] Date: 12/25/2015 Reason for consult: Initial assessment;Difficult latch  Visited with Mom and FOB, baby 7 hrs old.  Mom has attempted to feed baby, but no latch yet.  Baby skin to skin, and cueing to feed.  Teaching on best positioning done.  Mom has short shaft nipples, and edematous areola.  After several attempts with latching in cross cradle, and then football hold, initiated a manual pump to evert nipple and initiate flow.  Manual breast expression demonstrated, but unable to initiate a flow.  After use of manual pump, baby placed in football hold, and latched.  Mom described feeling a tug, and nice rhythmic jaw extensions noted.  Breast shells given with instruction.  Mom very encouraged that baby has latched well on both breasts.  Identified swallows and shared with parents.  Discussed with RN about plan of pre pumping and wearing breast shells once she has a bra on.   Brochure left in room.  Informed Mom and FOB about IP and OP lactation services available to them.  Encouraged skin to skin and cue based feedings, and to call for help prn.  Consult Status Consult Status: Follow-up Date: 12/26/15 Follow-up type: In-patient    Judee Clara 12/25/2015, 1:41 PM

## 2015-12-25 NOTE — Progress Notes (Addendum)
FOB at bedside.  Subjective: Slept some. +FM. Comfortable w/ epidural. No urge to push.  Objective: BP 126/88 mmHg  Pulse 87  Temp(Src) 98.1 F (36.7 C) (Oral)  Resp 18  Ht  (1.676 m)  Wt 76.204 kg (168 lb)  BMI 27.13 kg/m2  SpO2 99%  LMP 03/13/2015   Today's Vitals   12/25/15 0058 12/25/15 0101 12/25/15 0130 12/25/15 0200  BP:  117/71 137/90 126/88  Pulse:  225 77 87  Temp: 98.1 F (36.7 C)     TempSrc: Oral     Resp:  Height:      Weight:      SpO2:      PainSc:  0-No pain  0-No pain   FHT: BL 148 w/ moderate variability, +accels, intermittent variables & lates UC:   regular, every 2 minutes, MVUs 235 SVE:   Dilation: 10 Effacement (%): 90 Station: +1 Exam by:: B Boyer RN @ 0211 Pitocin at 8 mU/min as of 2355 Continues to leak clear amniotic fluid  Assessment:  IUP at 41.0 wks 2nd stage labor Overall reassuring FHRT. Cat 2 resolved w/ intrauterine resuscitative measures GBS positive Factor V Leiden carrier-heterozygous  Plan: Trial push ineffective. Will allow passive descent & re-evaluate in 1 hr Expect SVD  Sherre Scarlet CNM 12/25/2015, 2:24 AM     ADDENDUM: Pushing commenced at 3:24 AM   Sherre Scarlet, CNM 12/25/15, 4:45 AM

## 2015-12-25 NOTE — Anesthesia Postprocedure Evaluation (Signed)
Anesthesia Post Note  Patient: Marissa Molina  Procedure(s) Performed: * No procedures listed *  Patient location during evaluation: Mother Baby Anesthesia Type: Epidural Level of consciousness: awake Pain management: pain level controlled Vital Signs Assessment: post-procedure vital signs reviewed and stable Respiratory status: spontaneous breathing Cardiovascular status: stable Postop Assessment: no headache, no backache, epidural receding, patient able to bend at knees, no signs of nausea or vomiting and adequate PO intake Anesthetic complications: no    Last Vitals:  Filed Vitals:   12/25/15 1000 12/25/15 1430  BP: 109/65 115/70  Pulse: 73 67  Temp: 36.9 C 36.7 C  Resp: 20 18    Last Pain:  Filed Vitals:   12/25/15 1445  PainSc: 2                  [name]

## 2015-12-25 NOTE — Progress Notes (Signed)
  Subjective: Pushing w/ good effort -- pushing now x 1 hr 20 min.  Objective: BP 145/84 mmHg  Pulse 89  Temp(Src) 101.3 F (38.5 C) (Axillary)  Resp 18  Ht  (1.676 m)  Wt 76.204 kg (168 lb)  BMI 27.13 kg/m2  SpO2 99%  LMP 03/13/2015   Total I/O In: -  Out: 1600 [Urine:1600] Today's Vitals   12/25/15 0230 12/25/15 0301 12/25/15 0332 12/25/15 0448  BP: 128/82 121/68 145/84   Pulse: 82 80 89   Temp: 98.8 F (37.1 C)   101.3 F (38.5 C)  TempSrc: Oral   Axillary  Resp: Height:      Weight:      SpO2:      PainSc:       FHT: BL 180 w/ moderate variability, +accels, intermittent moderate variables & lates  UC:  irregular, every 1-2 minutes Pitocin reduced to 4 mU/min  Assessment:  Fetal tachycardia Suspected chorio Cat 2 FHRT Borderline tachysystole GBS positive  Plan: Begin ATB Continue pushing IVF bolus + other intrauterine resuscitative measures Consult as needed   Sherre Scarlet CNM 12/25/2015, 4:53 AM

## 2015-12-25 NOTE — Lactation Note (Signed)
This note was copied from a baby's chart. Lactation Consultation Note  Patient Name: Marissa Molina [id number] Date: 12/25/2015 Reason for consult: Follow-up assessment Baby at 14 hr of life and RN is concerned that baby has not eaten today. Mom Reported baby has had 2 good feedings before this one. Mom denies breast or nipple pain but has bilateral nipple bruising. When the baby came off the R nipple there was a compression stripe. Discussed getting a deeper latch and nipple care. Mom can easily express colostrum, there is a spoon in the room. Reviewed feeding frequency, waking techniques, baby belly size, breast changes, pumping, voids, wt loss, and baby behavior. Mom is aware of OP services and support group.   Maternal Data    Feeding Feeding Type: Breast Fed Length of feed: 15 min  LATCH Score/Interventions Latch: Repeated attempts needed to sustain latch, nipple held in mouth throughout feeding, stimulation needed to elicit sucking reflex. Intervention(s): Skin to skin;Waking techniques Intervention(s): Breast compression  Audible Swallowing: Spontaneous and intermittent Intervention(s): Hand expression  Type of Nipple: Everted at rest and after stimulation  Comfort (Breast/Nipple): Filling, red/small blisters or bruises, mild/mod discomfort  Problem noted: Cracked, bleeding, blisters, bruises Interventions  (Cracked/bleeding/bruising/blister): Expressed breast milk to nipple  Hold (Positioning): No assistance needed to correctly position infant at breast. Intervention(s): Support Pillows;Position options  LATCH Score: 8  Lactation Tools Discussed/Used     Consult Status Consult Status: Follow-up Date: 12/26/15 Follow-up type: In-patient    Rulon Eisenmenger 12/25/2015, 8:48 PM

## 2015-12-25 NOTE — Progress Notes (Addendum)
Sleeping, yet easily aroused. Spouse present and supportive.  Subjective: Comfortable w/ epidural. +FM.  Objective: BP-117/71 T-98.1 P-77 R-18 FHT: BL 130 w/ moderate variability, +accels, intermittent mod variables/lates UC:   irregular, every 1 1/2 - 2 1/2 minutes, MVUs 200 SVE: 9/100/+1 per RN at 0102  Pitocin at 8 mU/min as of 2355  Assessment:  IUP at 41.0 wks Active labor GBS positive Cat 2 FHRT initially, now Cat 1 w/ intrauterine resuscitative measures  Plan: Reassess in 1 hr Continue plan Expect progress and SVD Dr. Estanislado Pandy updated  Mayford Knife, [facility] CNM 12/25/2015, 1:32 AM

## 2015-12-26 ENCOUNTER — Encounter (HOSPITAL_COMMUNITY): Payer: Self-pay | Admitting: *Deleted

## 2015-12-26 ENCOUNTER — Inpatient Hospital Stay (HOSPITAL_COMMUNITY)
Admission: RE | Admit: 2015-12-26 | Discharge: 2015-12-26 | Disposition: A | Discharge status: Home / Self Care | Payer: BC Managed Care – PPO | Source: Ambulatory Visit | Attending: Obstetrics & Gynecology | Admitting: Obstetrics & Gynecology

## 2015-12-26 LAB — CBC
HEMATOCRIT: 30.5 % — AB (ref 36.0–46.0)
Hemoglobin: 10 g/dL — ABNORMAL LOW (ref 12.0–15.0)
MCH: 29.5 pg (ref 26.0–34.0)
MCHC: 32.8 g/dL (ref 30.0–36.0)
MCV: 90 fL (ref 78.0–100.0)
PLATELETS: 161 10*3/uL (ref 150–400)
RBC: 3.39 MIL/uL — ABNORMAL LOW (ref 3.87–5.11)
RDW: 14.7 % (ref 11.5–15.5)
WBC: 12.6 10*3/uL — AB (ref 4.0–10.5)

## 2015-12-26 MED ORDER — FERROUS SULFATE 325 (65 FE) MG PO TABS
325.0000 mg | ORAL_TABLET | Freq: Two times a day (BID) | ORAL | Status: DC
  Administered 2015-12-26 – 2015-12-27 (×3): 325 mg via ORAL
  Filled 2015-12-26 (×3): qty 1

## 2015-12-26 NOTE — Progress Notes (Signed)
Postpartum day #1, NSVD  Subjective Pt without complaints.  Lochia normal.  Pain controlled.  Breast feeding yes.  Desires circumcision.  Temp:  [97.7 F (36.5 C)-98.8 F (37.1 C)] 98.8 F (37.1 C) (02/13 1833) Pulse Rate:  [69-72] 72 (02/13 1833) Resp:  [17-20] 17 (02/13 1833) BP: (119-120)/(76-79) 119/79 mmHg (02/13 1833)  Gen:  NAD, A&O x 3 Uterine fundus:  Firm, nontender Lochia normal Ext:  Edema none, no calf tenderness bilaterally  CBC    Component Value Date/Time   WBC 12.6* 12/26/2015 0530   RBC 3.39* 12/26/2015 0530   HGB 10.0* 12/26/2015 0530   HCT 30.5* 12/26/2015 0530   PLT 161 12/26/2015 0530   MCV 90.0 12/26/2015 0530   MCH 29.5 12/26/2015 0530   MCHC 32.8 12/26/2015 0530   RDW 14.7 12/26/2015 0530     A/P: S/p SVD doing well. Routine postpartum care. Lactation support. Pt and husband consented for circumcision. Discharge in am.  Mycheal Veldhuizen 12/26/2015, 8:30 AM

## 2015-12-26 NOTE — Lactation Note (Signed)
This note was copied from a baby's chart. Lactation Consultation Note  Patient Name: Marissa Molina [id number] Date: 12/26/2015 Reason for consult: Follow-up assessment;Breast/nipple pain;Difficult latch   Follow up with mom of 32 hour old infant. Infant with 6 Bf for 10-40 minutes, 3 attempts, 3 voids and 4 stools in last 24 hours. LATCH Scores 7-8 by bedside RN's. Infant weight 6 lb 13 oz with at 3% weight loss since birth.   Mom reports that she is having difficulty with latching infant at times and that her prefers right side over left side. Mom with soft breasts and compressible areola. Nipples short shaft and everted right nipple larger diameter that left. Mom reports left nipple is painful and had a small amount of bleeding earlier today. Positional stripe noted to left nipple with a small amount of scabbing noted. Right breast with bruising noted to top of nipple area. Mom reports she has been using the football hold on both breasts. Mom feels infant is latching better than earlier today, she is concerned over sore nipples. She is relatching infant when the latch is painful to get a deeper latch.   Infant noted to have a recessed chin, otherwise oral exam unremarkable. Infant was noted to expend tongue over gumline. He is noted to be chomping with breast feeding. Taught parents how to do suck training to get infant into sucking pattern before going to breast.   Infant cueing to feed. Mom placed infant to left breast in football hold. Mom used great pillow support with feeding. Latch was very painful for mom, infant noted to be chomping at breast and having difficulty getting into a sucking pattern. Infant was removed from nipple, nipple noted to be compressed with blanched stripe to center, mom tearful with latch. Assisted mom in placing infant to left breast in cross cradle hold, infant chomped for about 10 times before initiating a good sucking rhythm. Discussed difference between shallow  and deep latch. Mom reported that the pain was gone after initial latch. Infant nursed for 15 minutes with flanged lips, rhythmic sucking and intermittent swallows. Nipple was round when Infant self detached. Mom compressing breast with feeding.  Marissa Molina was in a deep sleep and relaxed after feeding.  Enc mom not to use football on left side for a few feedings to allow tissue to heal. Nipple care taught. Comfort gels and Breast shells given to mom with instructions for use. Discussed feeding cues and cluster feeding with parents.   Plan: Feed 8-12 x in 24 hours at first feeding cues for at least 15 - 20 minutes/feeding STS with infant with and between feedings Suck training prior to each feeding EBM to nipples post feed and allow to air dry Comfort gels after feeding until warm Breast Shells between feeding when not wearing comfort gels Maintain I/O Sheet Call for assistance with feedings prn       Maternal Data Formula Feeding for Exclusion: No  Feeding Feeding Type: Breast Fed Length of feed: 15 min  LATCH Score/Interventions Latch: Grasps breast easily, tongue down, lips flanged, rhythmical sucking. Intervention(s): Skin to skin;Teach feeding cues;Waking techniques Intervention(s): Adjust position;Assist with latch;Breast massage;Breast compression  Audible Swallowing: Spontaneous and intermittent  Type of Nipple: Everted at rest and after stimulation  Comfort (Breast/Nipple): Engorged, cracked, bleeding, large blisters, severe discomfort Problem noted: Cracked, bleeding, blisters, bruises (positional stripes to both nipples. small amt bleeding  to left nipple per mom) Intervention(s): Expressed breast milk to nipple     Hold (Positioning):  Assistance needed to correctly position infant at breast and maintain latch. Intervention(s): Breastfeeding basics reviewed;Support Pillows;Position options;Skin to skin  LATCH Score: 7  Lactation Tools Discussed/Used Tools:  Shells;Pump;Comfort gels Shell Type: Inverted Breast pump type: Manual   Consult Status Consult Status: Follow-up Date: 12/27/15 Follow-up type: In-patient    Marissa Molina 12/26/2015, 3:41 PM

## 2015-12-27 MED ORDER — IBUPROFEN 600 MG PO TABS: 600.0000 mg | ORAL_TABLET | Freq: Four times a day (QID) | ORAL | Status: AC

## 2015-12-27 MED ORDER — ENOXAPARIN SODIUM 40 MG/0.4ML ~~LOC~~ SOLN: 40.0000 mg | SUBCUTANEOUS | Status: AC

## 2015-12-27 NOTE — Lactation Note (Signed)
This note was copied from a baby's chart. Lactation Consultation Note  Patient Name: Marissa Molina [id number] Date: 12/27/2015 Reason for consult: Follow-up assessment;Breast/nipple pain Mom is continuing to have nipple pain with nursing. Pain is not improving. Baby is cluster feeding wanting to be at the breast every hour.  Mom reports baby will get deep latch with initial latch but then pulls back becoming shallow. Baby does have recessed chin and Mom has short nipple shafts bilateral. Mom reports she has had nipple cracking but no cracking noted today. Assisted Mom with positioning, baby does have difficulty sustaining good depth and keeping bottom lip down. Tried #24 and #20 nipple shield. Baby obtained and sustained better depth with #20 nipple shield, colostrum visible in nipple shield. Mom reports improvement with nipple pain. Without nipple shield LC observed dimpling with suckling, this also improved with using the nipple shield. Encouraged Mom to use #20 nipple shield to help with pain and baby sustain depth with latch. Mom applying EBM and using comfort gels. Engorgement care reviewed if needed. Encouraged to pre-pump to help with latch. If baby still wanting to nurse every hour, encouraged Mom to post pump after some feedings and give baby back any amount of EBM she receives. OP f/u schedules for Tuesday, 01/03/16 at 1:00. Call for questions/concerns.  Maternal Data    Feeding Feeding Type: Breast Fed Length of feed: 5 min  LATCH Score/Interventions Latch: Grasps breast easily, tongue down, lips flanged, rhythmical sucking. Intervention(s): Adjust position;Assist with latch;Breast massage;Breast compression  Audible Swallowing: A few with stimulation  Type of Nipple: Everted at rest and after stimulation (short nipple shafts bilateral) Intervention(s): Hand pump;Reverse pressure  Comfort (Breast/Nipple): Filling, red/small blisters or bruises, mild/mod  discomfort Intervention(s): Expressed breast milk to nipple;Other (comment) (comfort gels)     Hold (Positioning): Assistance needed to correctly position infant at breast and maintain latch. Intervention(s): Breastfeeding basics reviewed;Support Pillows;Position options;Skin to skin  LATCH Score: 7  Lactation Tools Discussed/Used Tools: Nipple Dorris Carnes;Pump Nipple shield size: 20;24 Breast pump type: Manual   Consult Status Consult Status: Complete Date: 12/27/15 Follow-up type: In-patient    Alfred Levins 12/27/2015, 12:02 PM

## 2015-12-27 NOTE — Discharge Instructions (Signed)

## 2015-12-27 NOTE — Discharge Summary (Signed)
OB Discharge Summary     Patient Name: Marissa Molina DOB: [birth year] MRN: [id number]  Date of admission: 12/24/2015 Delivering MD: Sherre Scarlet   Date of discharge: 12/27/2015  Admitting diagnosis: CTX Intrauterine pregnancy: [redacted]w[redacted]d     Secondary diagnosis:  Principal Problem:   Vaginal delivery Active Problems:   Heterozygous factor V Leiden complicating pregnancy, antepartum (HCC)   Positive GBS test   Second-degree perineal laceration, with delivery  Additional problems: None     Discharge diagnosis: Term Pregnancy Delivered, Chorioamnionitis intrapartum, s/p Unasyn x 24 hours                                                                             Post partum procedures:Lovenox 40 mg  Augmentation: None  Complications: Intrauterine Inflammation or infection (Chorioamniotis)  Hospital course:  Onset of Labor With Vaginal Delivery     35 y.o. yo G1P1001 at [redacted]w[redacted]d was admitted in Active Labor on 12/24/2015. Patient had an uncomplicated labor course as follows:  Membrane Rupture Time/Date: 7:56 PM ,12/24/2015   Intrapartum Procedures: Episiotomy: None [1]                                         Lacerations:  2nd degree [3];Perineal [11]  Patient had a delivery of a Viable infant. 12/25/2015  Information for the patient's newborn:  Lilou, Kneip [[id number]]  Delivery Method: Vaginal, Spontaneous Delivery (Filed from Delivery Summary)    Pateint had an uncomplicated postpartum course.  She is ambulating, tolerating a regular diet, passing flatus, and urinating well. Patient is discharged home in stable condition on 12/27/2015.    Physical exam  Filed Vitals:   12/25/15 2158 12/26/15 0535 12/26/15 1833 12/27/15 0550  BP: 113/75 120/76 119/79 119/77  Pulse: 68 69 72 63  Temp: 97.6 F (36.4 C) 97.7 F (36.5 C) 98.8 F (37.1 C) 97.8 F (36.6 C)  TempSrc: Oral Oral Oral   Resp: Height:      Weight:      SpO2:       General: alert,  cooperative and no distress Lochia: appropriate Uterine Fundus: firm Incision: N/A DVT Evaluation: No evidence of DVT seen on physical exam. No significant calf/ankle edema. Labs: Lab Results  Component Value Date   WBC 12.6* 12/26/2015   HGB 10.0* 12/26/2015   HCT 30.5* 12/26/2015   MCV 90.0 12/26/2015   PLT 161 12/26/2015   CMP Latest Ref Rng 12/25/2015  Creatinine 0.44 - 1.00 mg/dL 7.82    Discharge instruction: per After Visit Summary and "Baby and Me Booklet".  After visit meds:    Medication List    TAKE these medications        ibuprofen 600 MG tablet  Commonly known as:  ADVIL,MOTRIN  Take 1 tablet (600 mg total) by mouth every 6 (six) hours.     prenatal multivitamin Tabs tablet  Take 1 tablet by mouth daily at 12 noon.       Lovenox 40 mg once daily  Diet: routine diet  Activity: Advance as tolerated. Pelvic rest for 6 weeks.  Outpatient follow up:2 weeks Follow up Appt:No future appointments. Follow up Visit:No Follow-up on file.  Postpartum contraception: Undecided  Newborn Data: Live born female  Birth Weight: 7 lb 0.9 oz (3200 g) APGAR: 7, 9  Baby Feeding: Breast Disposition:home with mother   Circumcision done day of discharge.   12/27/2015 Geryl Rankins, MD

## 2016-01-03 ENCOUNTER — Ambulatory Visit (HOSPITAL_COMMUNITY)
Admit: 2016-01-03 | Discharge: 2016-01-03 | Disposition: A | Discharge status: Home / Self Care | Payer: BC Managed Care – PPO | Attending: Obstetrics & Gynecology | Admitting: Obstetrics & Gynecology

## 2016-01-03 NOTE — Lactation Note (Signed)
Lactation Consult  Mother's reason for visit:  Needs help with initial latching and mother has questions about pumping her breast.  Appointment Notes: Mother states that she has pain on the initial latch. She states that she has sore nipple and that at times nipple bleed. Mother states that infant feeds every one to three hours.  Consult:  Initial Lactation Consultant:  Marissa Molina  ________________________________________________________________________  Marissa Molina Name: Marissa Molina Date of Birth: 12/25/2015 Pediatrician:Dr Eddie Candle Gender: female Gestational Age: [redacted]w[redacted]d (At Birth) Birth Weight: 7 lb 0.9 oz (3200 g) Weight at Discharge: Weight: 6 lb 9.5 oz (2990 g)Date of Discharge: 12/27/2015 [facility] Weights   12/25/15 2341 12/26/15 2310 12/27/15 0832  Weight: 6 lb 13 oz (3090 g) 6 lb 9.3 oz (2985 g) 6 lb 9.5 oz (2990 g)   Weight today: 7-7.9,3400     Admission Information     Provider Service Admission Date    Michiel Sites, MD Newborn 12/25/2015          ADT Events       Unit Room Bed Service Event    12/25/15 0626 [facility] [id number] Newborn Admission    12/25/15 0633 [facility] [id number] Newborn Transfer Out    12/25/15 0633 [facility] [id number] Newborn Transfer In    12/25/15 0635 [facility] [id number] Newborn Patient Update    12/27/15 1200 [facility] 9150 [id number] Newborn Discharge      Weight Information (last 3 days) before discharge     Date/Time   Weight   BSA (Calculated - sq m)   BMI (Calculated) Who      12/27/15 0832  6 lb 9.5 oz (2990 g)  --  -- EB     12/26/15 2310  6 lb 9.3 oz (2985 g)  --  -- MA     12/25/15 2341  6 lb 13 oz (3090 g)  --  -- MA     12/25/15 0626  7 lb 0.9 oz (3200 g)  0.22 sq meters  11.5 KH           Weights    Go to now       12/23/15 - Today         One Day Eight Hours  View All           ________________________________________________________________________  Mother's Name: Marissa Molina Type of delivery:  vaginal del Breastfeeding Experience:  none Maternal Medical Conditions:  factor 5 Maternal Medications:  Prenatal vit, Lovenox  ________________________________________________________________________  Breastfeeding History (Post Discharge)  Frequency of breastfeeding:  Every 1-3 hours Duration of feeding:   15-45 mins   Pumping  Type of pump:  Medela pump in style Frequency:  3 times  Volume:  3-6 ounces  Infant Intake and Output Assessment  Voids:  4-6 + in 24 hrs.  Color:  Clear yellow Stools:  6-10 in 24 hrs.  Color:  Yellow  ________________________________________________________________________  Maternal Breast Assessment  Breast:  Full Nipple:  Erect Pain level:  5 initial latch but deminishes Pain interventions:  Bra, Expressed breast milk and Lanolin  _______________________________________________________________________ Feeding Assessment/Evaluation:  Mother supplements with ebm and gives 3 ounces 2-3 times daily Mother states that she pumps 3-6 ounces each time she pumps.   Initial feeding assessment: Observed that infant latched on with a very shallow latch. Mother in lots of pain describes pain scale of 5-6  Out of 10. Assistance given to guide infant on with an off sideded latch. Several attempts to correct  latch. Lots of teaching and assistance given to mother. Infant sustained latch for 15-20 mins and became sleepy. Transfer was 28 ml  Mother unaware that [name] may need an additional feeding. Suggested that she offer the second breast. Infant still cuing. Infant sustained an additional feeding on alternate breast for 15 mins. He transferred 24 ml   Infant's oral assessment:  WNL  Positioning:  Cross cradle Left breast  LATCH documentation:  Latch:  2 = Grasps breast easily, tongue down, lips flanged, rhythmical  sucking.  Audible swallowing:  2 = Spontaneous and intermittent  Type of nipple:  2 = Everted at rest and after stimulation  Comfort (Breast/Nipple):  1 = Filling, red/small blisters or bruises, mild/mod discomfort  Hold (Positioning):  1 = Assistance needed to correctly position infant at breast and maintain latch  LATCH score:  8  Attached assessment:  Deep  Lips flanged:  Yes.    Lips untucked:  Yes.    Suck assessment:  Displays both   Pre-feed weight:  3400  Post-feed weight:  3428 Amount transferred:28 ml       Infant's oral assessment:  WNL  Positioning:  Cross cradle Right breast  LATCH documentation:  Latch:  2 = Grasps breast easily, tongue down, lips flanged, rhythmical sucking.  Audible swallowing:  2 = Spontaneous and intermittent  Type of nipple:  2 = Everted at rest and after stimulation  Comfort (Breast/Nipple):  1 = Filling, red/small blisters or bruises, mild/mod discomfort  Hold (Positioning):  1 = Assistance needed to correctly position infant at breast and maintain latch  LATCH score:  8  Attached assessment:  Deep  Lips flanged:  Yes.    Lips untucked:  Yes.    Suck assessment:  Displays both    Pre-feed weight: 3428  Post-feed weight: 3462  Amount transferred:  24 ml Total amount transferred: 52 ml  Advised mother to continue to cue base feed infant and at least 8-12 times in 24 hours Mother to work on off sided [employer] Mother to offer second breast if [name] still cuing.  Mother to post pump 2 times daily due to concerns about milk supply when going back to work.

## 2016-05-01 ENCOUNTER — Other Ambulatory Visit (HOSPITAL_COMMUNITY)
Admission: RE | Admit: 2016-05-01 | Discharge: 2016-05-01 | Disposition: A | Discharge status: Home / Self Care | Payer: BC Managed Care – PPO | Source: Ambulatory Visit | Attending: Obstetrics & Gynecology | Admitting: Obstetrics & Gynecology

## 2016-05-01 ENCOUNTER — Other Ambulatory Visit: Payer: Self-pay | Admitting: Obstetrics & Gynecology

## 2016-05-01 DIAGNOSIS — Z1151 Encounter for screening for human papillomavirus (HPV): Secondary | ICD-10-CM | POA: Diagnosis not present

## 2016-05-01 DIAGNOSIS — Z01419 Encounter for gynecological examination (general) (routine) without abnormal findings: Secondary | ICD-10-CM | POA: Insufficient documentation

## 2016-05-02 LAB — CYTOLOGY - PAP

## 2017-04-15 ENCOUNTER — Other Ambulatory Visit: Payer: Self-pay | Admitting: Family Medicine

## 2017-04-15 DIAGNOSIS — M79652 Pain in left thigh: Secondary | ICD-10-CM

## 2017-04-15 DIAGNOSIS — D6851 Activated protein C resistance: Secondary | ICD-10-CM

## 2017-04-22 ENCOUNTER — Ambulatory Visit
Admission: RE | Admit: 2017-04-22 | Discharge: 2017-04-22 | Disposition: A | Discharge status: Home / Self Care | Payer: BC Managed Care – PPO | Source: Ambulatory Visit | Attending: Family Medicine | Admitting: Family Medicine

## 2017-04-22 DIAGNOSIS — M79652 Pain in left thigh: Secondary | ICD-10-CM

## 2017-04-22 DIAGNOSIS — D6851 Activated protein C resistance: Secondary | ICD-10-CM

## 2017-09-20 IMAGING — US US EXTREM LOW VENOUS*L*
1 series · 13 of 24 positions shown · non-contrast
Comparison: None.

CLINICAL DATA: History of factor 5 Oruc mutation, now with
lateral left thigh pain for the past 5 weeks. Evaluate for DVT.



[Series 1: us extrem low venous*left* · 13 of 33 slices shown]
[im 1/33]
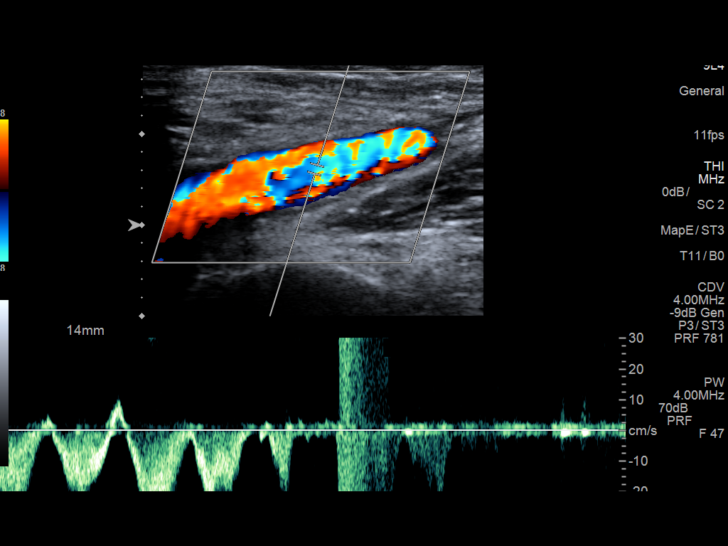
[im 3/33]
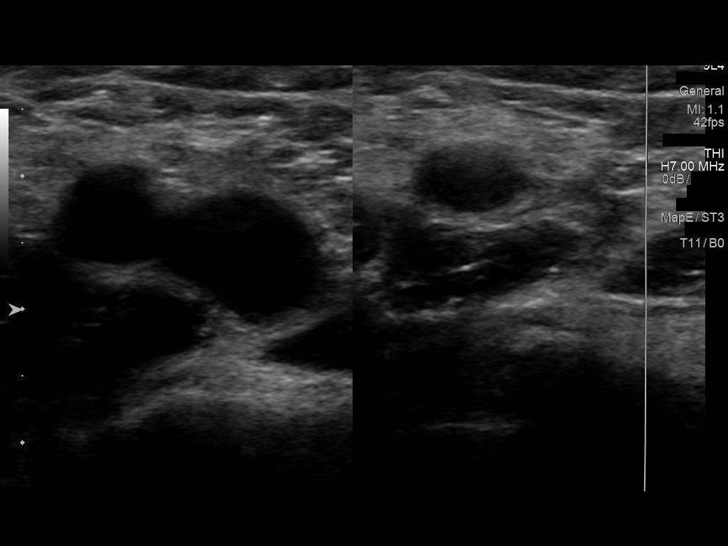
[im 6/33]
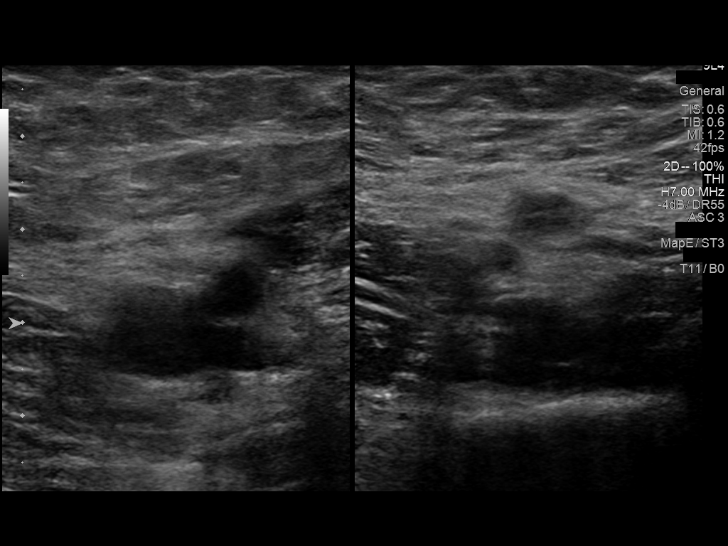
[im 9/33]
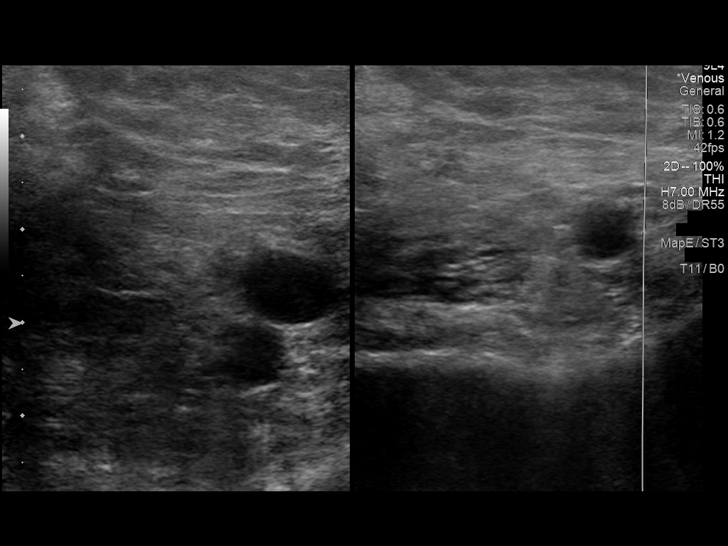
[im 12/33]
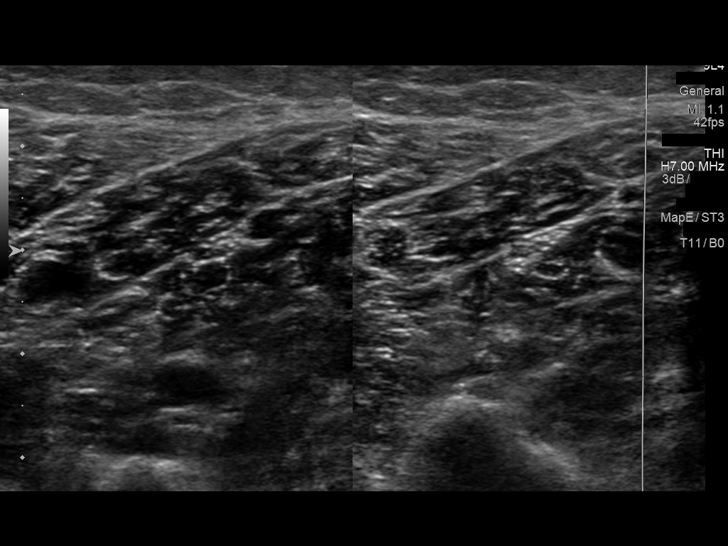
[im 14/33]
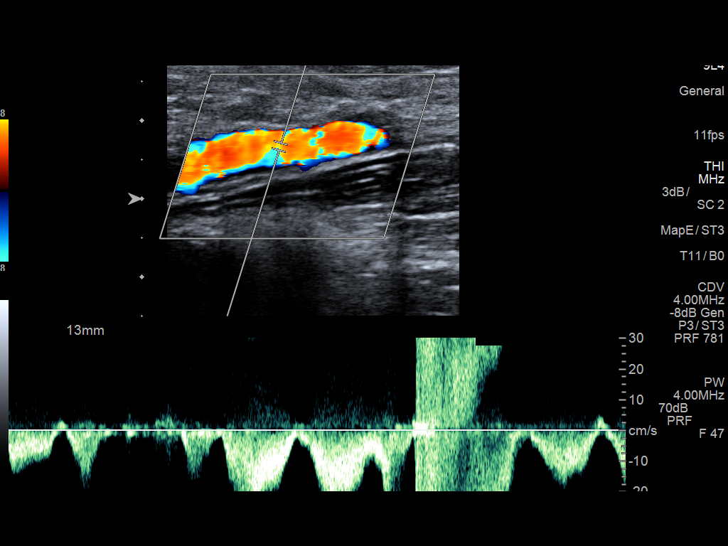
[im 17/33]
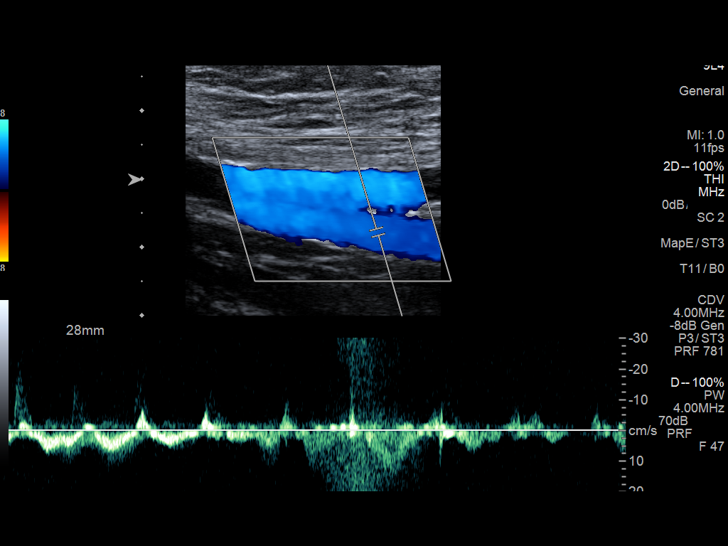
[im 19/33]
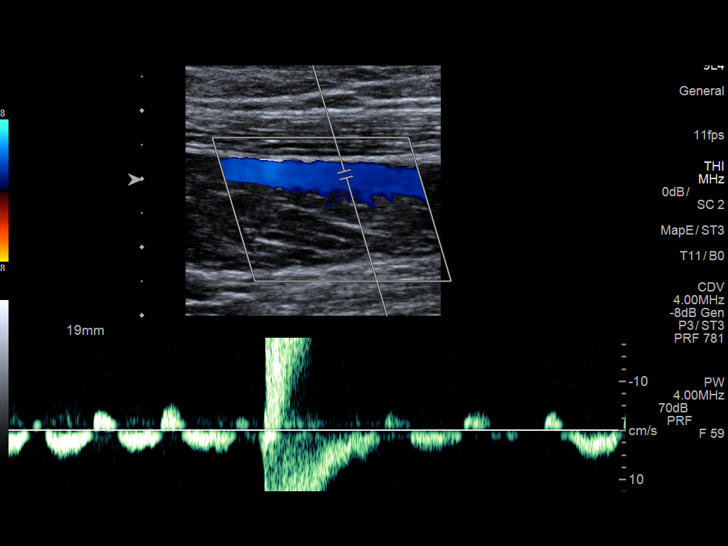
[im 21/33]
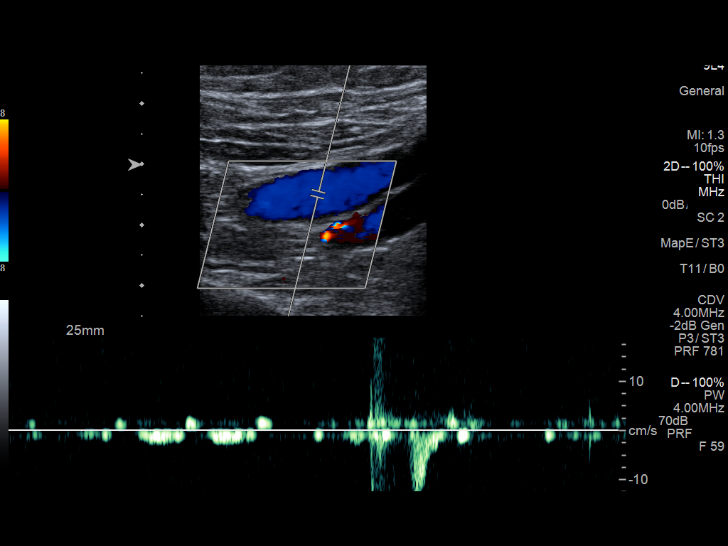
[im 24/33]
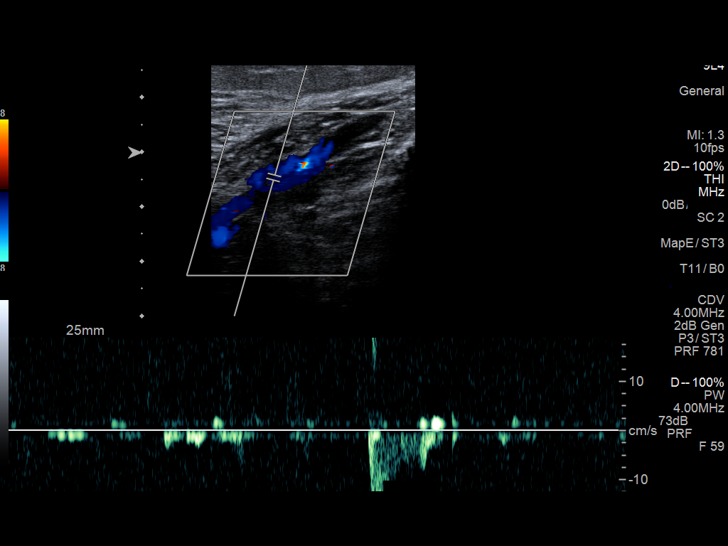
[im 27/33]
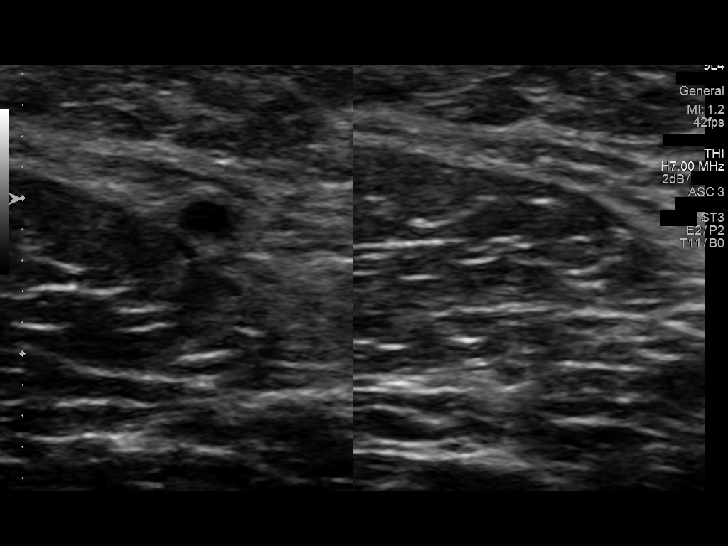
[im 30/33]
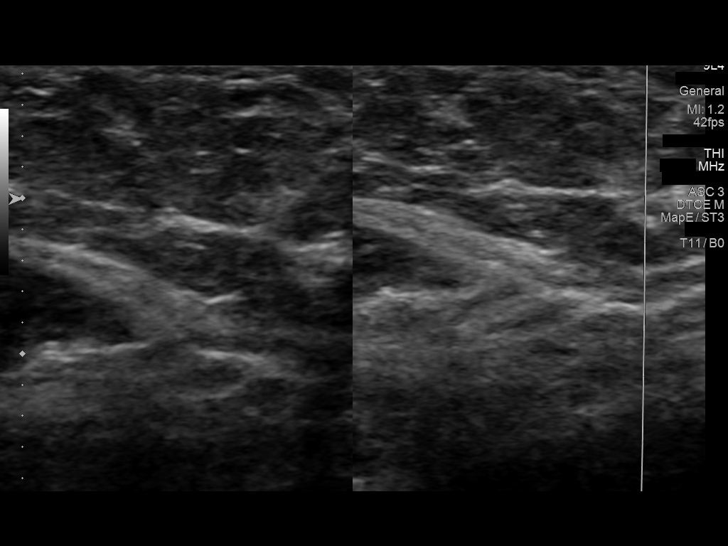
[im 33/33]
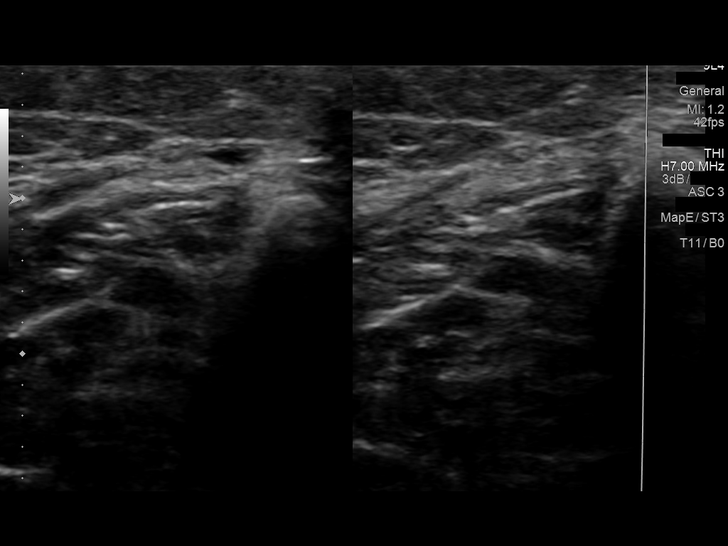

[13 of 24 positions shown; findings below may reference images not displayed]

FINDINGS: Contralateral Common Femoral Vein: Respiratory phasicity is normal
and symmetric with the symptomatic side. No evidence of thrombus.
Normal compressibility.

Common Femoral Vein: No evidence of thrombus. Normal
compressibility, respiratory phasicity and response to augmentation.

Saphenofemoral Junction: No evidence of thrombus. Normal
compressibility and flow on color Doppler imaging.

Profunda Femoral Vein: No evidence of thrombus. Normal
compressibility and flow on color Doppler imaging.

Femoral Vein: No evidence of thrombus. Normal compressibility,
respiratory phasicity and response to augmentation.

Popliteal Vein: No evidence of thrombus. Normal compressibility,
respiratory phasicity and response to augmentation.

Calf Veins: No evidence of thrombus. Normal compressibility and flow
on color Doppler imaging.

Superficial Great Saphenous Vein: No evidence of thrombus. Normal
compressibility and flow on color Doppler imaging.

Venous Reflux:  None.

Other Findings:  None.
IMPRESSION: No evidence of DVT within the left lower extremity.

## 2018-08-10 LAB — UNMAPPED LAB RESULTS: Hematocrit (HT): 35 % (ref 35–47)

## 2018-11-09 LAB — UNMAPPED LAB RESULTS
ABO RH Blood Type (HT): O POS
Antibody Screen (HT): NEGATIVE
Hematocrit (HT): 42 % (ref 35–47)
Hemoglobin (HGB) (HT): 13.9 g/dL (ref 12.0–16.0)
MCHC (HT): 32.9 g/dL (ref 31.0–37.5)
MCV (HT): 88 fL (ref 80–100)
Mean Corpuscular Hemoglobin (MCH) (HT): 29 pg (ref 26.0–34.0)
Platelets (HT): 229 10 3/uL (ref 150–450)
RBC (HT): 4.79 10 6/uL (ref 3.80–5.20)
RDW (HT): 13.8 % (ref 0.0–15.2)
WBC (HT): 10.9 10 3/uL (ref 4.0–11.0)

## 2018-11-09 LAB — TREPONEMA PALLIDUM (SYPHILIS) ANTIBODY (HT): Syphilis Treponemal Ab (HT): NONREACTIVE

## 2018-11-18 LAB — UNMAPPED LAB RESULTS
Hematocrit (HT): 42.5 % (ref 34.0–47.0)
Hemoglobin (HGB) (HT): 14.4 g/dL (ref 11.5–16.0)
MCHC (HT): 33.9 g/dL (ref 32.0–36.0)
MCV (HT): 86.6 FL (ref 81.0–99.0)
Mean Corpuscular Hemoglobin (MCH) (HT): 29.3 pg (ref 26.0–34.0)
Platelets (HT): 347 10 3/uL (ref 140–400)
RBC (HT): 4.91 10 6/uL (ref 3.80–5.20)
RDW (HT): 13 % (ref 11.5–15.0)
WBC (HT): 7.1 10 3/uL (ref 4.0–10.8)

## 2021-01-05 ENCOUNTER — Encounter: Payer: Self-pay | Admitting: Gastroenterology

## 2021-01-31 ENCOUNTER — Encounter: Payer: Self-pay | Admitting: Primary Care

## 2021-01-31 ENCOUNTER — Ambulatory Visit: Payer: PRIVATE HEALTH INSURANCE | Admitting: Primary Care

## 2021-01-31 ENCOUNTER — Ambulatory Visit: Payer: Self-pay | Admitting: Primary Care

## 2021-01-31 VITALS — BP 102/64 | HR 70 | Ht 65.39 in | Wt 143.3 lb

## 2021-01-31 DIAGNOSIS — Z7689 Persons encountering health services in other specified circumstances: Secondary | ICD-10-CM

## 2021-01-31 DIAGNOSIS — Z0000 Encounter for general adult medical examination without abnormal findings: Secondary | ICD-10-CM

## 2021-01-31 DIAGNOSIS — J309 Allergic rhinitis, unspecified: Secondary | ICD-10-CM

## 2021-01-31 DIAGNOSIS — D6851 Activated protein C resistance: Secondary | ICD-10-CM

## 2021-01-31 NOTE — Progress Notes (Signed)
SUBJECTIVE    CHIEF COMPLAINT:   Chief Complaint   Patient presents with    New Patient Visit       HPI:     Establish Care with New Doctor:   New patient to the practice.  Last PCP was in [place], last seen about 7 years ago.  Concerns today include:      1. Factor 5 Leiden heterozygous. Saw Hem/onc in remote past, didn't require f/up.     PHQ2=0  IPV screen:neg    PMH / Family Hx / Social Hx  Patient's medications, allergies, past medical, surgical, social and family histories were reviewed and updated as appropriate.  Patient Active Problem List    Diagnosis Date Noted    Factor V Leiden carrier 01/31/2021    Allergic rhinitis 05/09/2006     Created by Conversion          History reviewed. No pertinent past medical history.  Past Surgical History:   Procedure Laterality Date    KNEE SURGERY      Knee Surgery Conversion Data      Family History   Problem Relation Age of Onset    High Blood Pressure Mother     Anemia Mother     Other Father         CLL    DVT (Deep Vein Thrombosis) Sister     High Blood Pressure Maternal Grandmother     Breast cancer Maternal Grandmother         >age 37    Heart failure Maternal Grandfather     High Blood Pressure Maternal Grandfather     Other Paternal Grandfather         CLL     Social History     Socioeconomic History    Marital status: Married     Spouse name: Not on file    Number of children: Not on file    Years of education: Not on file    Highest education level: Not on file   Occupational History    Not on file   Tobacco Use    Smoking status: Never Smoker    Smokeless tobacco: Never Used   Substance and Sexual Activity    Alcohol use: Yes     Alcohol/week: 4.0 standard drinks     Types: 4 Standard drinks or equivalent per week    Drug use: Never    Sexual activity: Not Currently     Partners: Male     Comment: husband   Social History Narrative    Moved from [place] in 2019. 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       ROS As above/below otherwise negative.    OBJECTIVE    BP 102/64    Pulse 70    Ht 1.661 m (5' 5.39")    Wt 65 kg (143 lb 4.8 oz)    SpO2 99%    BMI 23.56  kg/m     General: well-appearing female, NAD, pleasant  Head: NCAT  EENT: MMM  Neck: Supple, trachea midline, no LAD, no thyromegaly  Heart: RRR w/out MRG, S1, S2  Lungs: CTAB, good air entry to bases b/l, no crackles, wheezes or rhonchi, normal respiratory effort  Abd: soft, NTTP, ND, no guarding  Ext: warm, no edema  Neuro: AAOx3, CN II-XII grossly intact, sensory and motor grossly intact  Skin: skin warm and dry, no rashes noted   Psych: affect is appropriate      ASSESSMENT & PLAN    1. Allergic rhinitis  - controlled    2. Establishing care with new doctor, encounter for    3. Factor V Leiden carrier    4. Health care maintenance  - Hepatitis C antibody; Future    Patient verbalized understanding of information presented,and confirmed agreement with current plan of care. Reviewed risks, benefits, and administration of prescribed/refilled medications. Precautions reviewed.  Encouraged to contact me / office with any questions or concerns.      Follow up if symptoms worsen or fail to improve. or return to office sooner prn      Beryle Quant, MD  [facility]  01/31/2021  4:33 PM    Note dictated using dragon speech software.  A reasonable effort was made at proofreading, but there may be minor transcription errors present.

## 2021-02-01 ENCOUNTER — Telehealth: Payer: Self-pay | Admitting: Primary Care

## 2021-02-01 NOTE — Telephone Encounter (Signed)
Pt stated for continuity of care only. Will send to PCP to sign off on an see if anything is needed now.

## 2021-02-01 NOTE — Telephone Encounter (Signed)
Writer received signed release of information to receive records from Dr. [facility]. Left vm for pt to call the office. Please see if pt is trying all gyn care here, or if it is just for continuity of care. Placed in accordion. Writer has release on desk.

## 2021-02-21 NOTE — Telephone Encounter (Signed)
Is this ROI in the phone room now?

## 2021-02-24 NOTE — Telephone Encounter (Signed)
ROI not in phone room

## 2021-03-06 NOTE — Progress Notes (Deleted)
[facility] - Outpatient Progress Note    SUBJECTIVE    Connie Allen is a 40 y.o. female who presents for No chief complaint on file.      No problem-specific Assessment & Plan notes found for this encounter.    Patient's medications, allergies, past medical, surgical, social and family histories were reviewed and updated as appropriate.    Review of Systems   Constitutional: Negative for fever and chills.   Respiratory: Negative for shortness of breath.    Cardiovascular: Negative for chest pain.   Gastrointestinal: Negative for abdominal pain.     No current outpatient medications on file.         OBJECTIVE    There were no vitals taken for this visit.  GEN: Pleasant and cooperative in no apparent distress.  HEENT: TM's clear with good light reflex, throat clear/no lesions. Oral mucosa moist.   Neck: supple, no thyromegaly, no LAD.  Cardiovascular: Normal rate and regular rhythm, S1S2, no murmurs, rubs, or gallops. Pulses are palpable.    Pulmonary/Chest: Effort normal and breath sounds normal, no wheezes, rales, or rhonchi.   Abd: soft, NT/ND, +BSx4, no masses, no tenderness, no hepatosplenomegaly.   Ext: no clubbing, edema, or cyanosis.  Skin: no lesions or rashes present. Good turgor & capillary refill.   R axilla:  ***      ASSESSMENT & PLAN    No diagnosis found.    Follow up ***  Precautions reviewed.    The appointment was concluded after confirming with the *** that they fully understood the above treatment plan. They denied having any further questions or concerns but know to call or message if any issues develop prior to their next appointment.    Quentin Angst, M.D.

## 2021-03-08 ENCOUNTER — Ambulatory Visit: Payer: PRIVATE HEALTH INSURANCE | Admitting: Primary Care

## 2022-02-02 ENCOUNTER — Other Ambulatory Visit: Payer: Self-pay

## 2022-02-02 ENCOUNTER — Ambulatory Visit: Payer: PRIVATE HEALTH INSURANCE | Admitting: Primary Care

## 2022-02-02 VITALS — BP 118/78 | HR 77 | Ht 65.63 in | Wt 136.0 lb

## 2022-02-02 DIAGNOSIS — R21 Rash and other nonspecific skin eruption: Secondary | ICD-10-CM

## 2022-02-02 DIAGNOSIS — Z0000 Encounter for general adult medical examination without abnormal findings: Secondary | ICD-10-CM

## 2022-02-02 DIAGNOSIS — L918 Other hypertrophic disorders of the skin: Secondary | ICD-10-CM

## 2022-02-02 NOTE — Progress Notes (Signed)
Health Maintenance Visit, Female Aged 41-49 Years    Connie Allen is a 41 y.o. female here for a HCM Visit     Interval History    Skin tag on left chest, catches on clothing.   Rash underneath bra    Would you like to become pregnant in the next year?  No.    Medications, allergies, medical, surgical, family, and social history reviewed today    Problem List  Patient Active Problem List   Diagnosis Code    Allergic rhinitis J30.9    Factor V Leiden carrier D68.51       Medical History  No past medical history on file.    Surgical History  Past Surgical History:   Procedure Laterality Date    KNEE SURGERY      Knee Surgery Conversion Data        Medications  No current outpatient medications on file prior to visit.     No current facility-administered medications on file prior to visit.       Allergies  Allergies   Allergen Reactions    Environmental Allergies Itching       Social History  Social History     Tobacco Use    Smoking status: Never    Smokeless tobacco: Never   Substance Use Topics    Alcohol use: Yes     Alcohol/week: 4.0 standard drinks of alcohol     Types: 4 Standard drinks or equivalent per week    Drug use: Never     Social History     Social History Narrative    Moved from [place] in 2019. 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[employer]    Hobbies: hiking and kayaking    Diet: eats pretty healthy, avoids red meat    Exercise: walks the dog, otherwise nothing regular    -updated 01/31/21        Family History  Family History   Problem Relation Age of Onset    High Blood Pressure Mother     Anemia Mother     Other Father         CLL    DVT (Deep Vein Thrombosis) Sister     High Blood Pressure Maternal Grandmother     Breast cancer Maternal Grandmother         >age 56    Heart failure Maternal Grandfather     High Blood Pressure Maternal Grandfather      Other Paternal Grandfather         CLL        Preventive Care  Health Maintenance Due   Topic Date Due    Hepatitis C Screening USPSTF/[place]  Never done    Cervical Cancer Screening USPSTF  Never done    IMM DTaP/Tdap/Td (2 - Td or Tdap) 06/11/2017       Screening  Depression (PHQ2)   Often been bothered by feeling down, depressed or hopeless: 0   Often been bothered by little interest or pleasure in doing things: 0    Alcohol and Drug Use (AUDIT-C)   How often do you have a drink containing alcohol?   How many alcoholic drinks do you have on a typical day?   How often do you have 6 or more drinks on one occasion?  Total  score = 2   (0 to 12 scale, positiveif 4+ in men or 3+ in women, unless points just come from question #1)    Domestic violence (HITS)  How often does partner...   Physically hurt? 0   Insult or talk down? 0   Threaten with harm? 0   Scream or curse? 0    Physical Exam   Blood pressure 118/78, pulse 77, height 1.667 m (5' 5.63"), weight 61.7 kg (136 lb), SpO2 98 %.  Body mass index is 22.2 kg/m.    GEN:  Comfortable, healthy-appearing, normal body habitus  HEAD: Normocephalic and atraumatic, scalp normal  EYES: Sclera/conjunctiva/lids normal, PERRL, EOMI  EARS: TM's wnl b/l  NOSE: Nasal mucosa normal, no septal abnormalities, no discharge  MOUTH: good dentition, tongue normal, pharynx without erythema or lesions  NECK: No lymphadenopathy or masses, thyroid not enlarged  CV:  RRR, Normal S1/S2, murmur absent, no S3/S4  PULM: Lungs clear, no increased work of breathing  BREASTS: pt deferred  ABD: normal BS, no tenderness, no HSM or masses  EXT:  No cyanosis, clubbing or edema  MSK: No joint swelling or deformities  SKIN:  Pedunculated skin tag on left chest, scattered patches over upper chest.   NEURO:  Alert, oriented, no abnormalities of strength or coordination, normal gait, no tremor      ASSESSMENT & PLAN  Routine Well Adult Visit    Screening    Weight: healthy weight   Counseling  provided: N/A   Blood Pressure: normal   Sexually active: yes   Chlamydia and GC DNA amplification testing ordered: no     HIV and Hep C test ordered: no   Plan B script given: no   Contraception discussed: yes   Patient elects to use: condoms, husband plans to get vasectomy   Concerns about intimate partner violence: no   Problem alcohol/drug use identified: no   Tobacco Use identified: no   Lipid screening not indicated (USPSTF recommends at age 4 unless smoker, hypertensive, obese, or FH of premature CVD)   Diabetes screening  not indicated   (USPSTF recommends only if sustained BP > 135/80, BMI over 30)   PAP recommended (every 3 yrs till 30, after 30 every 5 if with HPV testing)   PAP up to date: pt reports she had normal pap smear 3 years ago, will obtain records   Breast Cancer screening discussed    Mammogram: pt will schedule at [facility]   Discussed importance of adequate Calcium and Vitamin D intake   Recommended Folic Acid 0.8 mg daily if considering pregnancy    Counseling/Anticipatory Guidance   Counseled on nutrition diet and regular exercise    Counseled regarding safer sex practices and STI's   Counseled about risks of skin cancer and importance of sunscreen protection (USPSTF recommends ages 33-24)   Advance directives discussed and patient given HCP form    Immunizations/Prophylaxis   Tdap already has   COVID  already has    1. Annual physical exam    2. Skin tag  - see procedure note     3. Rash  Appears most consistent with tinea. Recommended OTC clotrimazole cream. If does not improve with treatment, will let me know.       Delos Haring, MD

## 2022-02-02 NOTE — Procedures (Signed)
Informed verbal consent was obtained. The skin was cleaned with alcohol. Sterile Iris scissors were used to snip the skin tags. These pathognomonic lesions are not sent for pathology. Bleeding was controlled and the area was covered with antibiotic ointment and a bandage.    Delos Haring, MD

## 2023-12-18 ENCOUNTER — Encounter: Payer: Self-pay | Admitting: Primary Care

## 2023-12-18 ENCOUNTER — Other Ambulatory Visit: Payer: Self-pay

## 2023-12-18 ENCOUNTER — Ambulatory Visit: Payer: PRIVATE HEALTH INSURANCE | Admitting: Primary Care

## 2023-12-18 VITALS — BP 112/68 | HR 81 | Ht 65.63 in | Wt 134.9 lb

## 2023-12-18 DIAGNOSIS — S0592XA Unspecified injury of left eye and orbit, initial encounter: Secondary | ICD-10-CM

## 2023-12-18 MED ORDER — POLYMYXIN B-TRIMETHOPRIM 10000-0.1 UNIT/ML-% OP SOLN *I*
1.0000 [drp] | Freq: Three times a day (TID) | OPHTHALMIC | 0 refills | Status: AC
Start: 2023-12-18 — End: 2023-12-23

## 2023-12-18 NOTE — Progress Notes (Signed)
Outpatient Progress Note    SUBJECTIVE    Connie Allen  is a 43 y.o. female  here for follow up visit.  Today we discussed:    Left Eye Injury  This morning opened the car door into her face  Hit her left eye  Around 8 am  Flushed the eye  Took her contacts out  Had severe pain and nausea,  but better  Took her contacts out  Has history of corneal abrasion in the past as a teenager  Eye feels dry  No foreign body sensation  No visual changes  No photophobia      The patient's past medical history, problem list, medications and allergies reviewed in electronic chart today.      OBJECTIVE    Blood pressure 112/68, pulse 81, height 1.667 m (5' 5.63"), weight 61.2 kg (134 lb 14.4 oz), last menstrual period 12/15/2023, SpO2 98%, not currently breastfeeding.   Body mass index is 22.02 kg/m.   Gen: NAD, appears well, pleasant  HEENT: anicteric, no conjunctival pallor, left eye conjunctival hemorrhage in the inner upper portion of the eye, abrasions above the upper lid and underneath the eye with some redness underneath the eye, no crepitus of the orbital bone  Psych: normal affect      ASSESSMENT & PLAN  1. Left eye injury (Primary)    She is worried about a corneal abrasion   We did not have the fluorescein in office  Will cover with topical antibiotics  Referral made to ophthalmology  Any new or worsening symptoms, advised to follow up right away.    Any new or worsening symptoms, advised to follow up right away.   - AMB REFERRAL TO OPHTHALMOLOGY/OPTOMETRY  - trimethoprim-polymyxin b (POLYTRIM) ophthalmic solution; Place 1 drop into the left eye 3 times daily for 5 days for Wound to the Cornea caused by Scraping.  Dispense: 10 mL; Refill: 0      Pt agrees with and understands plan.    No follow-ups on file. Return to office sooner prn.     Arvil Chaco, MD     No current outpatient medications on file prior to visit.     No current facility-administered medications on file prior to visit.

## 2023-12-19 ENCOUNTER — Ambulatory Visit: Payer: PRIVATE HEALTH INSURANCE | Admitting: Optometrist

## 2023-12-19 DIAGNOSIS — H16142 Punctate keratitis, left eye: Secondary | ICD-10-CM

## 2023-12-19 DIAGNOSIS — S0512XA Contusion of eyeball and orbital tissues, left eye, initial encounter: Secondary | ICD-10-CM

## 2023-12-19 NOTE — Progress Notes (Signed)
Assessment/Plan:      1. Periorbital contusion, left, initial encounter        2. Punctate keratitis of left eye          -no EOM restriction   -low suspicion for orbital fracture    PLAN:    1,2. Continue polytrim tid OS x 5 days, then d/c. Samples PFATs provided for prn use.

## 2024-01-28 ENCOUNTER — Encounter: Payer: PRIVATE HEALTH INSURANCE | Admitting: Primary Care

## 2024-02-10 ENCOUNTER — Other Ambulatory Visit: Payer: Self-pay

## 2024-02-11 ENCOUNTER — Ambulatory Visit: Payer: PRIVATE HEALTH INSURANCE | Admitting: Primary Care

## 2024-02-11 ENCOUNTER — Encounter: Payer: Self-pay | Admitting: Primary Care

## 2024-02-11 VITALS — BP 112/82 | Ht 65.0 in | Wt 136.0 lb

## 2024-02-11 DIAGNOSIS — Z1239 Encounter for other screening for malignant neoplasm of breast: Secondary | ICD-10-CM

## 2024-02-11 DIAGNOSIS — Z0000 Encounter for general adult medical examination without abnormal findings: Secondary | ICD-10-CM

## 2024-02-11 NOTE — Progress Notes (Signed)
 Health Maintenance Visit, Female Aged 43-64 Years    Connie Allen is a 43 y.o. female here for a HCM Visit    Interval History    No concerns    Medications, allergies, medical, surgical, family, and social history reviewed today    Patient Active Problem List   Diagnosis Code    Allergic rhinitis J30.9    Factor V Leiden carrier D68.51     Medications Ordered Prior to Encounter[1]    Social History[2]      Depression (PHQ2)  PHQ Total Score: 0 (02/11/2024  3:49 PM)    No questionnaires on file.    Alcohol and Drug Use (AUDIT-C)  AUDIT-C Score, out of 12.  (Complete full questionnaire if score is 3 or more): 2 (02/11/2024  3:49 PM)  AUDIT Score (Automatic): 2 (02/11/2024  3:49 PM)      Physical Exam   Blood pressure 112/82, height 1.651 m (5\' 5" ), weight 61.7 kg (136 lb).  Body mass index is 22.63 kg/m.  General Appearance:    Alert, cooperative, no distress, appears stated age   Head:    Normocephalic   Eyes:    Conjunctiva clear and without pallor, sclerae anicteric   Ears:   TM's wnl b/l   Oropharynx:   Buccal mucosa normal; good dentition; oropharynx clear   Neck:   Supple, no thyromegaly   Lungs:     Clear to auscultation bilaterally without W/R/R, unlabored respiratory effort    Heart:    Regular rate and rhythm, S1 and S2 normal, no murmur, rub or gallop   Breasts:   Defer to AGY   Abdomen:     Soft, non-tender, no masses, no hepatosplenomegaly   Extremities:   Warm and well perfused, legs w/o edema   Musculoskeletal:   Palms, fingers, and nails normal to inspection   Skin:   Warm, dry, good turgor, no rashes in visible areas   Lymph nodes:   Cervical and supraclavicular nodes normal   Neurologic:   A&O x 3, normal gait, no tremor   Psychiatric:   Normal mood and affect     ASSESSMENT & PLAN  Routine Well Adult Visit    Screening checklist (see orders)  Gyn care through [facility]? Yes If so, Pap up to date: no  Diabetes screening (USPSTF recommends screening in adults aged 35-70 years with BMI 25 or  greater)  Lipid screening  (USPSTF recommends every 3-5 years)  Breast Cancer screening discussed  Yes  Mammogram: Ordered (USPSTF recommends age 30 q31yrs, consider stopping at age 23)  Colon cancer screening: Not indicated (generally can stop at age 6)  Lung cancer screening indicated: no (USPSTF recommends yearly ages 49-80 if >/= 20 pack year history unless no tobacco use in last 15 years)  Osteoporosis risk assessed: no  (use FRAX score to assess - USPSTF recommends at this age only if 10 year fracture risk > 9.3%)  Hepatitis C testing (USPSTF recommends testing all patients. [place] law requires 1 time testing)  HIV test ([place] law requires all > 13 to be offered HIV testing at least once)  Syphilis screen (USPSTF recommends if at increased risk)  Chlamydia and GC DNA amplification testing (consider if at increased risk)    Preventive Care  Health Maintenance Due   Topic Date Due    Hepatitis C Screening USPSTF/[place]  Never done    IMM-Hepatitis B Vaccine (1 of 3 - 19+ 3-dose series) Never done    Cervical Cancer  Screening (USPSTF/ACOG)  Never done    Breast Cancer Screening USPSTF  Never done    COVID-19 Vaccine (5 - 2024-25 season) 07/14/2023       Counseling/Anticipatory Guidance  Discussed importance of adequate Calcium and Vitamin D intake  Nutritional and exercise counseling provided   Advance directives discussed and HCP form given    Immunizations/Prophylaxis  The vaccination status of the following vaccines were addressed at today's visit:  COVID  was offered and declined by patient, risks/benefits reviewed  Shingrix  was not indicated (2 dose series after age 62, 2-6 months apart)  Tdap  already has  Prevnar 20 (high risk conditions/smoker/age 6+)  was not indicated    1. Annual physical exam (Primary)  - Hepatitis C Virus Antibody With Reflex To Hepatitis C Quantitative; Future  - Lipid Panel (Reflex to Direct  LDL if Triglycerides more than 400); Future    2. Breast cancer screening  - Mammography  screening BILATERAL; Future    3. Health care maintenance  - Measles IgG antibody; Future     F/up in a few weeks for AGY/pap    Delos Haring, MD         [1]   No current outpatient medications on file prior to visit.     No current facility-administered medications on file prior to visit.   [2]   Social History  Socioeconomic History    Marital status: Married   Tobacco Use    Smoking status: Never    Smokeless tobacco: Never   Substance and Sexual Activity    Alcohol use: Yes     Alcohol/week: 4.0 standard drinks of alcohol     Types: 4 Standard drinks or equivalent per week    Drug use: Never    Sexual activity: Not Currently     Partners: Male     Comment: husband   Social History Narrative    Moved from [place] in 2019. 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

## 2024-02-13 ENCOUNTER — Telehealth: Payer: Self-pay | Admitting: Primary Care

## 2024-02-13 NOTE — Telephone Encounter (Signed)
 Writer Lm for patient to call the office to see if she was going to schedule her mammogram or if she would like locations and phone number. Thank You.

## 2024-02-17 NOTE — Telephone Encounter (Signed)
 Writer call patient to see if she schedule or would like the office to schedule appt. Patient states right after the appt she schedule with [facility] for 03/05/24 at 12:45 pm. Thank You.

## 2024-03-05 ENCOUNTER — Other Ambulatory Visit: Payer: Self-pay | Admitting: Primary Care

## 2024-04-17 ENCOUNTER — Other Ambulatory Visit
Admission: RE | Admit: 2024-04-17 | Discharge: 2024-04-17 | Disposition: A | Discharge status: Home / Self Care | Payer: PRIVATE HEALTH INSURANCE | Source: Ambulatory Visit | Attending: Primary Care | Admitting: Primary Care

## 2024-04-17 DIAGNOSIS — Z0000 Encounter for general adult medical examination without abnormal findings: Secondary | ICD-10-CM | POA: Insufficient documentation

## 2024-04-17 LAB — LIPID PANEL
Chol/HDL Ratio: 2.3
Cholesterol: 158 mg/dL
HDL: 68 mg/dL — ABNORMAL HIGH (ref 40–60)
LDL Calculated: 81 mg/dL
Non HDL Cholesterol: 90 mg/dL
Triglycerides: 39 mg/dL

## 2024-04-17 LAB — MEASLES IGG AB: Measles IgG: 1.4

## 2024-04-17 LAB — HEPATITIS C VIRUS ANTIBODY WITH REFLEX TO HEPATITIS C QUANTITATIVE: Hep C Ab: NEGATIVE

## 2024-04-20 ENCOUNTER — Ambulatory Visit: Payer: Self-pay | Admitting: Primary Care

## 2024-04-23 ENCOUNTER — Ambulatory Visit: Payer: PRIVATE HEALTH INSURANCE | Attending: Primary Care | Admitting: Primary Care

## 2024-04-23 ENCOUNTER — Encounter: Payer: Self-pay | Admitting: Primary Care

## 2024-04-23 ENCOUNTER — Other Ambulatory Visit: Payer: Self-pay

## 2024-04-23 VITALS — BP 110/60 | HR 61 | Temp 97.9°F | Ht 65.0 in | Wt 133.7 lb

## 2024-04-23 DIAGNOSIS — Z124 Encounter for screening for malignant neoplasm of cervix: Secondary | ICD-10-CM | POA: Insufficient documentation

## 2024-04-23 DIAGNOSIS — Z1239 Encounter for other screening for malignant neoplasm of breast: Secondary | ICD-10-CM | POA: Insufficient documentation

## 2024-04-23 DIAGNOSIS — Z01419 Encounter for gynecological examination (general) (routine) without abnormal findings: Secondary | ICD-10-CM | POA: Insufficient documentation

## 2024-04-23 NOTE — Progress Notes (Signed)
 GYN Visit For Ages 101-64 Years    Connie Allen is a 43 y.o. female here for a routine GYN Visit     Interval History    History of Present Illness  No concerns. Needs breast US  for breast cancer screening due to dense breast tissue. Mammogram was normal.     GYN History  Menses: just finishing her period, no concerns    PAP  History of abnormal pap smear: No.    Reviewed problem list, PMH, medications, and allergies in electronic chart    Social History[1]      Screening  Depression (PHQ2)  PHQ Total Score: 0 (04/23/2024  3:57 PM)    No questionnaires on file.    Alcohol and Drug Use (AUDIT-C)  AUDIT-C Score, out of 12.  (Complete full questionnaire if score is 3 or more): 2 (02/11/2024  3:49 PM)  AUDIT Score (Automatic): 2 (02/11/2024  3:49 PM)        Physical Exam   Blood pressure 110/60, pulse 61, temperature 36.6 C (97.9 F), temperature source Temporal, height 1.651 m (5' 5), weight 60.6 kg (133 lb 11.2 oz), last menstrual period 04/18/2024, SpO2 99%.  Body mass index is 22.25 kg/m.  GEN:  Alert, cooperative, no distress, appears stated age  ABD: Soft, non-tender, no masses, bladder normal to palpation  BREAST: Normal appearance, no skin changes.  No masses, tenderness or discharge. No axillary LAD  GU:   External genitalia normal in appearance, no lesions  Perineum normal in appearance, no rash or lesions  Urethral meatus normal, no lesions or prolapse   Vagina mucosa normal in appearance, minimal blood in vaginal vault, no discharge or lesions  Cervix normal in appearance, no discharge or lesions, no CMT  Uterus normal in size and position, no tenderness  Adnexa without mass/enlargement or tenderness    Assessment/Plan  Routine Gynecologic Exam     Screening  Pap done  Chlamydia and GC DNA amplification testing per orders (USPSTF recommends Chlamydia in women >= 25 yrs if at increased risk)  HIV test per orders ([place] law requires all > 13 to be offered HIV testing at least once)  Syphilis  screen per orders (USPSTF recommends if at increased risk)  Hepatitis C test (USPSTF recommends for all adults)  Lipid screening UTD (USPSTF recommends at age 10 unless smoker, hypertensive, obese, or FH of premature CVD)  Diabetes screening  not indicated   (USPSTF recommends only if sustained BP > 135/80)  Breast Cancer screening discussed (if>40). Mammogram: Up to date   Colon cancer screening: Not indicated (generally can stop at age 6)  Lung cancer screening indicated: no (USPSTF recommends yearly ages 59-80 if >/= 20 pack year history unless no tobacco use in last 15 years)  Osteoporosis risk assessed: no  (use FRAX score to assess - USPSTF recommends at this age only if 10 year fracture risk > 9.3%)    Preventive Care  Health Maintenance Due   Topic Date Due    IMM-Hepatitis B Vaccine (1 of 3 - 19+ 3-dose series) Never done    Cervical Cancer Screening (USPSTF/ACOG)  Never done    COVID-19 Vaccine (5 - 2024-25 season) 07/14/2023       Counseling/Anticipatory Guidance  Discussed importance of adequate Calcium and Vitamin D intake  Nutritional and exercise counseling provided   STI counseling provided (if increased risk)  Counseled about risks of skin cancer and importance of sunscreen protection  Advance directives discussed and HCP form given  and reviewed    Immunizations/Prophylaxis  The vaccination status of the following vaccines were addressed at today's visit:  Tdap  already has  Shingrix (age 79+, two dose series)  was not indicated  COVID was offered and declined by patient, risks/benefits reviewed  Prevnar 20 (high risk conditions/smoker or ae 50+)   was not indicated    1. Encounter for annual routine gynecological examination (Primary)    2. Cervical cancer screening  - GYN Cytology; Future    3. Breast cancer screening  Mammogram in 2025 was normal, breast US  ordered for dense breast tissue.   - US  breast complete BILATERAL; Future    Sula End, MD         [1]   Social History  Socioeconomic  History    Marital status: Married   Tobacco Use    Smoking status: Never    Smokeless tobacco: Never   Substance and Sexual Activity    Alcohol use: Yes     Alcohol/week: 4.0 standard drinks of alcohol     Types: 4 Standard drinks or equivalent per week    Drug use: Never    Sexual activity: Not Currently     Partners: Male     Comment: husband   Social History Narrative    Moved from [place] in 2019. Originally from [place], moved back to be closer to family     Lives with husband and 2 kids (ages 8 and 5)    Works from home, Tax inspector at [employer]    Hobbies: hiking and kayaking    Diet: eats pretty healthy, avoids red meat    Exercise: walks the dog, otherwise nothing regular    -updated 02/11/24

## 2024-04-25 LAB — HPV DNA PROBE WITH CYTOLOGY
HPV Other High Risk: NEGATIVE
HPV Type 16: NEGATIVE
HPV Type 18: NEGATIVE

## 2024-04-27 ENCOUNTER — Telehealth: Payer: Self-pay | Admitting: Primary Care

## 2024-04-27 ENCOUNTER — Other Ambulatory Visit: Payer: Self-pay | Admitting: Primary Care

## 2024-04-27 NOTE — Telephone Encounter (Signed)
 Req fax to [facility]. Thank You.

## 2024-05-05 ENCOUNTER — Ambulatory Visit: Payer: Self-pay | Admitting: Primary Care

## 2024-05-05 LAB — GYN CYTOLOGY

## 2024-05-05 NOTE — Telephone Encounter (Signed)
 Patient notified of negative results and verbalized understanding.
